# Patient Record
Sex: Female | Born: 1970 | Race: White | Hispanic: No | Marital: Married | Smoking: Never smoker
Health system: Southern US, Community
[De-identification: ages and names within clinical notes are randomized; demographics above are authoritative.]

## PROBLEM LIST (undated history)

## (undated) DIAGNOSIS — G510 Bell's palsy: Secondary | ICD-10-CM

## (undated) HISTORY — PX: HAND SURGERY: SHX662

## (undated) HISTORY — PX: NASAL SEPTUM SURGERY: SHX37

## (undated) HISTORY — DX: Bell's palsy: G51.0

## (undated) HISTORY — PX: MOUTH SURGERY: SHX715

## (undated) HISTORY — PX: APPENDECTOMY: SHX54

---

## 2001-03-21 ENCOUNTER — Observation Stay (HOSPITAL_BASED_OUTPATIENT_CLINIC_OR_DEPARTMENT_OTHER)
Admission: RE | Admit: 2001-03-21 | Disposition: A | Discharge status: Home / Self Care | Payer: Self-pay | Source: Ambulatory Visit | Admitting: Obstetrics & Gynecology

## 2001-04-08 ENCOUNTER — Inpatient Hospital Stay (HOSPITAL_BASED_OUTPATIENT_CLINIC_OR_DEPARTMENT_OTHER)
Admission: RE | Admit: 2001-04-08 | Disposition: A | Discharge status: Home / Self Care | Payer: Self-pay | Source: Ambulatory Visit | Admitting: Obstetrics & Gynecology

## 2002-06-29 ENCOUNTER — Emergency Department
Admission: EM | Admit: 2002-06-29 | Disposition: A | Discharge status: Home / Self Care | Payer: Self-pay | Source: Emergency Department | Admitting: Internal Medicine

## 2004-01-12 ENCOUNTER — Emergency Department
Admission: EM | Admit: 2004-01-12 | Disposition: A | Discharge status: Home / Self Care | Payer: Self-pay | Source: Emergency Department

## 2006-02-11 ENCOUNTER — Ambulatory Visit
Admission: AD | Admit: 2006-02-11 | Disposition: A | Discharge status: Home / Self Care | Payer: Self-pay | Source: Ambulatory Visit | Admitting: Orthopaedic Surgery

## 2006-08-04 ENCOUNTER — Emergency Department
Admission: EM | Admit: 2006-08-04 | Disposition: A | Discharge status: Home / Self Care | Payer: Self-pay | Source: Emergency Department | Admitting: Emergency Medicine

## 2006-09-26 ENCOUNTER — Emergency Department
Admission: EM | Admit: 2006-09-26 | Disposition: A | Discharge status: Home / Self Care | Payer: Self-pay | Source: Emergency Department | Admitting: Emergency Medicine

## 2006-09-28 LAB — URINALYSIS
Bilirubin, UA: NEGATIVE
Blood, UA: NEGATIVE
Glucose, UA: NEGATIVE
Ketones UA: NEGATIVE
Leukocyte Esterase, UA: NEGATIVE
Nitrate: NEGATIVE
Protein, UR: NEGATIVE
Specific Gravity, UR: 1.02 (ref 1.000–1.035)
Urobilinogen, UA: NORMAL
pH, Urine: 7.5 (ref 5.0–8.0)

## 2006-09-28 LAB — COMPREHENSIVE METABOLIC PANEL
ALT: 20 U/L (ref 7–56)
AST (SGOT): 22 U/L (ref 5–40)
Albumin, Synovial: 3.9 g/dL (ref 3.9–5.0)
Alkaline Phosphatase: 40 U/L (ref 38–126)
BUN / Creatinine Ratio: 15 (ref 8–20)
BUN: 12 mg/dL (ref 6–20)
Bilirubin, Total: 0.4 mg/dL (ref 0.2–1.3)
CO2: 25 mmol/L (ref 21.0–31.0)
Calcium: 9.2 mg/dL (ref 8.4–10.2)
Chloride: 104 mmol/L (ref 101–111)
Creatinine: 0.79 mg/dL (ref 0.5–1.4)
EGFR: >60 mL/min/{1.73_m2}
EGFR: >60 mL/min/{1.73_m2}
Glucose: 77 mg/dL (ref 70–100)
Potassium: 4.1 mmol/L (ref 3.6–5.0)
Protein, Total: 6.5 g/dL (ref 6.3–8.2)
Sodium: 137 mmol/L (ref 135–145)

## 2006-09-28 LAB — ^CBC WITH DIFF MCKESSON
BASOPHILS %: 0.3 % (ref 0–2)
Baso(Absolute): 0
Eosinophils %: 4.2 % (ref 0–6)
Eosinophils Absolute: 0.3
Hematocrit: 39.3 % (ref 27.0–49.5)
Hemoglobin: 13 g/dL (ref 11.7–15.5)
Lymphocytes Absolute: 2.2
Lymphocytes Relative: 37.3 % (ref 25–55)
MCH: 28.9 pg (ref 27.0–34.0)
MCHC: 33.1 % (ref 32.0–36.0)
MCV: 87.2 fL (ref 80–100)
Monocytes Absolute: 0.4
Monocytes Relative %: 6.9 % (ref 1–8)
Neutrophils Absolute: 3.1
Neutrophils Relative %: 51.3 % (ref 49–69)
Platelets: 293 10*3/uL (ref 150–400)
RBC: 4.51 /mm3 (ref 3.80–5.40)
RDW: 12.1 % (ref 11.0–14.0)
WBC: 6 10*3/uL (ref 4.8–10.8)

## 2006-09-28 LAB — HCG, SERUM, QUALITATIVE: Hcg Qualitative: NEGATIVE

## 2006-09-28 LAB — CHLAMYDIA TRACH/GC AMPLIFIED DETECTION
C. Trachomatis Amplified: NEGATIVE
N. Gonorrhoeae Amplified: NEGATIVE

## 2006-10-03 ENCOUNTER — Emergency Department
Admission: EM | Admit: 2006-10-03 | Disposition: A | Discharge status: Home / Self Care | Payer: Self-pay | Source: Emergency Department | Admitting: Emergency Medicine

## 2013-02-18 ENCOUNTER — Ambulatory Visit (HOSPITAL_BASED_OUTPATIENT_CLINIC_OR_DEPARTMENT_OTHER): Payer: BC Managed Care – PPO | Admitting: Anesthesiology

## 2013-02-18 ENCOUNTER — Ambulatory Visit (HOSPITAL_BASED_OUTPATIENT_CLINIC_OR_DEPARTMENT_OTHER): Payer: BC Managed Care – PPO | Admitting: Obstetrics & Gynecology

## 2013-02-18 ENCOUNTER — Ambulatory Visit
Admission: RE | Admit: 2013-02-18 | Discharge: 2013-02-18 | Disposition: A | Discharge status: Home / Self Care | Payer: BC Managed Care – PPO | Source: Ambulatory Visit | Attending: Obstetrics & Gynecology | Admitting: Obstetrics & Gynecology

## 2013-02-18 ENCOUNTER — Encounter (HOSPITAL_BASED_OUTPATIENT_CLINIC_OR_DEPARTMENT_OTHER): Payer: Self-pay

## 2013-02-18 ENCOUNTER — Ambulatory Visit: Payer: Self-pay

## 2013-02-18 ENCOUNTER — Encounter (HOSPITAL_BASED_OUTPATIENT_CLINIC_OR_DEPARTMENT_OTHER): Payer: Self-pay | Admitting: Anesthesiology

## 2013-02-18 ENCOUNTER — Encounter (HOSPITAL_BASED_OUTPATIENT_CLINIC_OR_DEPARTMENT_OTHER)
Admission: RE | Disposition: A | Discharge status: Home / Self Care | Payer: Self-pay | Source: Ambulatory Visit | Attending: Obstetrics & Gynecology

## 2013-02-18 DIAGNOSIS — O034 Incomplete spontaneous abortion without complication: Secondary | ICD-10-CM | POA: Insufficient documentation

## 2013-02-18 DIAGNOSIS — O021 Missed abortion: Secondary | ICD-10-CM | POA: Diagnosis present

## 2013-02-18 HISTORY — PX: D & C, SUCTION: SHX3666

## 2013-02-18 SURGERY — DILATION AND CURETTAGE (D&C), SUCTION
Anesthesia: Anesthesia General | Site: Uterus | Wound class: Clean Contaminated

## 2013-02-18 MED ORDER — LIDOCAINE HCL 2 % IJ SOLN
INTRAMUSCULAR | Status: DC | PRN
Start: 2013-02-18 — End: 2013-02-18
  Administered 2013-02-18: 50 mg

## 2013-02-18 MED ORDER — EPHEDRINE SULFATE 50 MG/ML IJ SOLN
INTRAMUSCULAR | Status: AC
Start: 2013-02-18 — End: ?
  Filled 2013-02-18: qty 1

## 2013-02-18 MED ORDER — SODIUM CHLORIDE 0.9 % IR SOLN
Status: DC | PRN
Start: 2013-02-18 — End: 2013-02-18
  Administered 2013-02-18: 1000 mL

## 2013-02-18 MED ORDER — FENTANYL CITRATE 0.05 MG/ML IJ SOLN
INTRAMUSCULAR | Status: AC
Start: 2013-02-18 — End: ?
  Filled 2013-02-18: qty 2

## 2013-02-18 MED ORDER — PROPOFOL INFUSION 10 MG/ML
INTRAVENOUS | Status: DC | PRN
Start: 2013-02-18 — End: 2013-02-18
  Administered 2013-02-18: 300 ug/kg/min via INTRAVENOUS

## 2013-02-18 MED ORDER — ONDANSETRON HCL 4 MG/2ML IJ SOLN
4.0000 mg | Freq: Three times a day (TID) | INTRAMUSCULAR | Status: DC | PRN
Start: 2013-02-18 — End: 2013-02-18

## 2013-02-18 MED ORDER — FENTANYL CITRATE 0.05 MG/ML IJ SOLN
25.0000 ug | INTRAMUSCULAR | Status: DC | PRN
Start: 2013-02-18 — End: 2013-02-18
  Administered 2013-02-18: 25 ug via INTRAVENOUS

## 2013-02-18 MED ORDER — PROPOFOL 10 MG/ML IV EMUL
INTRAVENOUS | Status: AC
Start: 2013-02-18 — End: ?
  Filled 2013-02-18: qty 50

## 2013-02-18 MED ORDER — ONDANSETRON HCL 4 MG/2ML IJ SOLN
4.0000 mg | Freq: Once | INTRAMUSCULAR | Status: DC | PRN
Start: 2013-02-18 — End: 2013-02-18

## 2013-02-18 MED ORDER — HYDROMORPHONE HCL PF 1 MG/ML IJ SOLN
0.5000 mg | INTRAMUSCULAR | Status: DC | PRN
Start: 2013-02-18 — End: 2013-02-18

## 2013-02-18 MED ORDER — KETOROLAC TROMETHAMINE 30 MG/ML IJ SOLN
INTRAMUSCULAR | Status: DC | PRN
Start: 2013-02-18 — End: 2013-02-18
  Administered 2013-02-18: 30 mg via INTRAVENOUS

## 2013-02-18 MED ORDER — OXYCODONE-ACETAMINOPHEN 5-325 MG PO TABS
1.0000 | ORAL_TABLET | Freq: Once | ORAL | Status: DC | PRN
Start: 2013-02-18 — End: 2013-02-18

## 2013-02-18 MED ORDER — MIDAZOLAM HCL 2 MG/2ML IJ SOLN
INTRAMUSCULAR | Status: AC
Start: 2013-02-18 — End: ?
  Filled 2013-02-18: qty 2

## 2013-02-18 MED ORDER — FENTANYL CITRATE 0.05 MG/ML IJ SOLN
INTRAMUSCULAR | Status: DC | PRN
Start: 2013-02-18 — End: 2013-02-18
  Administered 2013-02-18 (×4): 25 ug via INTRAVENOUS

## 2013-02-18 MED ORDER — MIDAZOLAM HCL 2 MG/2ML IJ SOLN
INTRAMUSCULAR | Status: DC | PRN
Start: 2013-02-18 — End: 2013-02-18
  Administered 2013-02-18: 2 mg via INTRAVENOUS

## 2013-02-18 MED ORDER — LACTATED RINGERS IV SOLN
INTRAVENOUS | Status: DC | PRN
Start: 2013-02-18 — End: 2013-02-18

## 2013-02-18 MED ORDER — LACTATED RINGERS IV SOLN
INTRAVENOUS | Status: DC
Start: 2013-02-18 — End: 2013-02-18

## 2013-02-18 MED ORDER — ONDANSETRON HCL 4 MG/2ML IJ SOLN
INTRAMUSCULAR | Status: DC | PRN
Start: 2013-02-18 — End: 2013-02-18
  Administered 2013-02-18: 4 mg via INTRAVENOUS

## 2013-02-18 MED ORDER — MEPERIDINE HCL 25 MG/ML IJ SOLN
25.0000 mg | INTRAMUSCULAR | Status: DC | PRN
Start: 2013-02-18 — End: 2013-02-18

## 2013-02-18 MED ORDER — DIPHENHYDRAMINE HCL 50 MG/ML IJ SOLN
6.2500 mg | Freq: Four times a day (QID) | INTRAMUSCULAR | Status: DC | PRN
Start: 2013-02-18 — End: 2013-02-18

## 2013-02-18 SURGICAL SUPPLY — 16 items
BOTTLE COLECTION SAFETY W/CAPS (Procedure Accessories) ×2 IMPLANT
CANNULA VAC ASP CRV BRKLY VCRT 8MM LF (Suction) ×2
CANNULA VACUUM ASPIRATION OD8 MM CURVE (Suction) ×1 IMPLANT
CANNULA VACUUM ASPIRATION OD8 MM CURVE BERKELEY VACURETTE ROUND TIP (Suction) IMPLANT
CATHETER URETHRAL OD14 FR STAGGER DRAINAGE EYE ROUND CLOSED TIP (Catheter Urine) IMPLANT
CATHETER URTH PVC DOV RBNL 14FR 16IN LF (Catheter Urine) IMPLANT
CONNECTOR TUBING L18 IN BOTTLE TO BOTTLE (Tubing) ×1 IMPLANT
CONNECTOR TUBING L18 IN GYRUS ACMI BOTTLE TO BOTTLE HOSE CONNECT (Tubing) ×1 IMPLANT
GLOVE SURG BIOGEL INDIC SZ 6.5 (Glove) ×2 IMPLANT
GLOVE SURG BIOGEL PF LTX SZ6.0 (Glove) ×2 IMPLANT
HOSE SAFETOUCH CONNECT DISP (Tubing) ×2
PACK LITHOTOMY MINOR (Pack) ×2 IMPLANT
PAD SANITARY L12.25 IN X W4.25 IN HEAVY ABSORBENT MOISTURE BARRIER (Dressing) IMPLANT
PAD SNTR SLK FLF CRTY 12.25X4.25IN LF NS (Dressing) ×2 IMPLANT
TUBING SCT PLS 3/8IN 6FT LF STRL SWVL (Tubing) ×1 IMPLANT
TUBING W HDL 3/8X6 STRL (Tubing) ×1

## 2013-02-18 NOTE — Op Note (Signed)
BRIEF OP NOTE    Date Time: 02/18/2013 8:57 AM    Patient Name:   Tina Gibson    Date of Operation:   02/18/2013    Providers Performing:   Surgeon(s):  Toleen Lachapelle, Ervin Knack, MD    Assistant (s):   Circulator  Scrub Person    Operative Procedure:   Procedure(s):  D & C, SUCTION    Preoperative Diagnosis:   Pre-Op Diagnosis Codes:     * Incomplete spontaneous abortion without mention of complication [634.91]    Postoperative Diagnosis:   * No post-op diagnosis entered *    Anesthesia:   IV Sedation    Estimated Blood Loss:   minimal    Implants:   * No implants in log *    Drains:   Drains: no    Specimens:   Minimal POC    Findings:   9cm uterus, minimal tissue removed    Complications:   none      Signed by: Chrissie Noa, MD                                                                           [facility]

## 2013-02-18 NOTE — Anesthesia Preprocedure Evaluation (Signed)
Anesthesia Evaluation    AIRWAY    Mallampati: II    TM distance: >3 FB  Neck ROM: full  Mouth Opening:full   CARDIOVASCULAR    regular and normal       DENTAL    No notable dental hx     PULMONARY    clear to auscultation     OTHER FINDINGS                      Anesthesia Plan    ASA 1     general                                 informed consent obtained    Plan discussed with CRNA.

## 2013-02-18 NOTE — Anesthesia Postprocedure Evaluation (Signed)
The patient is awake or easily arousable.      The patients respirations, and cardiovascular status have been evaluated and deemed stable post op.     Post op nausea, vomiting and pain have been treated and controlled as effectively as possible without compromising the patients respiratory and cardiovascular status.    Please refer to Post Op PACU Documentation for confirmation of attainment of normothermia and adequate hydration status.    There were no obvious anesthetic related complications.    The patient has recovered adequately and stable for discharge from the PACU

## 2013-02-18 NOTE — Op Note (Signed)
Procedure Date: 02/18/2013     Patient Type: A     SURGEON: Chrissie Noa MD  ASSISTANT:       PREOPERATIVE DIAGNOSIS:  Incomplete abortion.     POSTOPERATIVE DIAGNOSIS:  Incomplete abortion.     TITLE OF PROCEDURE:  Suction D and C.        INDICATIONS:     Repeat stay of a 42 year old patient with a 6-week pregnancy by dates, a  small gestational sac noted in the uterus on ultrasound in the office and  beta hCGs inappropriately rising in the 280-range, who presents to the  hospital for the scheduled dilation and curettage for a likely incomplete  abortion.     ANESTHESIA:   IV sedation.       ESTIMATED BLOOD LOSS:    Minimal.     SPECIMENS:    Minimal products of conception.     FINDINGS:  A 9 cm uterus and minimal tissue.     COMPLICATIONS:  None.     CONDITION:  Patient stable to PACU.     DESCRIPTION OF PROCEDURE:  The patient was taken to the operating room, where she was prepped and  draped in a sterile fashion in dorsal lithotomy position.  Bimanual  examination was performed prior to procedure and was notable for a 9 cm  anteverted uterus.  A speculum was placed, and the anterior lip of the  cervix was grasped with a long Allis clamp.  The cervix was then serially  dilated and sounded to approximately 9 cm.  An 8 mm curved suction tip was  used to remove a minimal amount of products of conception.  This was sent  to pathology for examination.  A medium-sized curette was then used to  completely curettage the remainder of the cavity.  Bleeding was minimal  throughout the procedure.  All hardware was then removed from the patient's  vagina, and she was taken stable to PACU.  She will be followed in the  office with a repeat beta hCG level to make sure that it completely falls  to 0 and that an ectopic is completely excluded.  Sponge, laparotomy, and  needle counts were correct x2.           D:  02/18/2013 09:59 AM by Dr. Chrissie Noa, MD ([id number])  T:  02/18/2013 13:23 PM by [id number]      Everlean Cherry: [id number])  (Doc ID: [id number])

## 2013-02-18 NOTE — Discharge Instructions (Signed)
Discharge Instructions for Gynecologic Surgery  You had gynecologic surgery. This sheet contains information about what you can and can't do after your surgery. Remember, you need to take it easy.  Activity   Continue the coughing and deep breathing exercises that you learned in the hospital.   Limit your activity for 1 week.   Avoid strenuous activities, such as mowing the lawn, vacuuming, or playing sports.   Limit your activity to short, slow walks. Gradually increase your pace and distance as you feel able.   Listen to your body. If an activity causes pain, stop.   Rest when you are tired.   Don't have sexual intercourse or use tampons or douches until your doctor says it's safe to do so.  Home Care   Shower as needed.   Check your temperature every day for1week(s) after your surgery.   Take your medication exactly as instructed by your doctor.   Return to your diet as you feel able. Eat a healthy, well-balanced diet.   Avoid constipation.   Use laxatives, stool softeners, or enemas as directed by your doctor.   Eat more high-fiber foods.   Drink6-8 glasses of water every day, unless directed otherwise.  Follow-Up  Make a follow-up appointment as directed by our staff.    When to Call Your Doctor  Call your doctor right away if you have any of the following:   Fever above101.5F (38.6C) or chills   Bright red vaginal bleeding or afoul-smellingdischarge   Vaginal bleeding that soaks more than onesanitary pad per hour   Trouble urinating or burning sensationwhen you urinate   Severe abdominal pain or bloating   Redness, swelling, or drainage at your incision site   Shortness of breath    2000-2014 [employer], [address], [place] [address]. All rights reserved. This information is not intended as a substitute for professional medical care. Always follow your healthcare professional's instructions.

## 2013-02-18 NOTE — Transfer of Care (Signed)
Anesthesia Transfer of Care Note    Patient: Tina Gibson    Procedures performed: Procedure(s) with comments:  D & C, SUCTION    Anesthesia type: General TIVA    Patient location:Gyn PACU    Last vitals:   Filed Vitals:    02/18/13 1004   BP: 101/65   Pulse: 67   Temp: 98.3 F (36.8 C)   Resp: 18   SpO2: 99%       Post pain: Patient not complaining of pain, continue current therapy      Mental Status:awake and alert     Respiratory Function: tolerating face mask    Cardiovascular: stable    Nausea/Vomiting: patient not complaining of nausea or vomiting    Hydration Status: adequate    Post assessment: no apparent anesthetic complications and no reportable events

## 2013-02-19 ENCOUNTER — Encounter (HOSPITAL_BASED_OUTPATIENT_CLINIC_OR_DEPARTMENT_OTHER): Payer: Self-pay | Admitting: Obstetrics & Gynecology

## 2014-06-09 ENCOUNTER — Emergency Department (HOSPITAL_COMMUNITY)
Admission: EM | Admit: 2014-06-09 | Discharge: 2014-06-09 | Disposition: A | Discharge status: Home / Self Care | Payer: BLUE CROSS/BLUE SHIELD | Attending: Emergency Medicine | Admitting: Emergency Medicine

## 2014-06-09 ENCOUNTER — Encounter (HOSPITAL_COMMUNITY): Payer: Self-pay | Admitting: Emergency Medicine

## 2014-06-09 DIAGNOSIS — R079 Chest pain, unspecified: Secondary | ICD-10-CM | POA: Insufficient documentation

## 2014-06-09 DIAGNOSIS — M25512 Pain in left shoulder: Secondary | ICD-10-CM | POA: Diagnosis not present

## 2014-06-09 DIAGNOSIS — R0602 Shortness of breath: Secondary | ICD-10-CM | POA: Diagnosis present

## 2014-06-09 LAB — CBC
HEMATOCRIT: 44.1 % (ref 36.0–46.0)
Hemoglobin: 15.5 g/dL — ABNORMAL HIGH (ref 12.0–15.0)
MCH: 30.6 pg (ref 26.0–34.0)
MCHC: 35.1 g/dL (ref 30.0–36.0)
MCV: 87.2 fL (ref 78.0–100.0)
PLATELETS: 181 10*3/uL (ref 150–400)
RBC: 5.06 MIL/uL (ref 3.87–5.11)
RDW: 12.5 % (ref 11.5–15.5)
WBC: 7.1 10*3/uL (ref 4.0–10.5)

## 2014-06-09 LAB — BASIC METABOLIC PANEL
Anion gap: 8 (ref 5–15)
BUN: 8 mg/dL (ref 6–23)
CHLORIDE: 105 meq/L (ref 96–112)
CO2: 23 mmol/L (ref 19–32)
Calcium: 9.1 mg/dL (ref 8.4–10.5)
Creatinine, Ser: 0.6 mg/dL (ref 0.50–1.10)
GFR calc Af Amer: >90 mL/min (ref >90)
GFR calc non Af Amer: >90 mL/min (ref >90)
Glucose, Bld: 93 mg/dL (ref 70–99)
POTASSIUM: 3.9 mmol/L (ref 3.5–5.1)
Sodium: 136 mmol/L (ref 135–145)

## 2014-06-09 LAB — I-STAT TROPONIN, ED
TROPONIN I, POC: 0 ng/mL (ref 0.00–0.08)
Troponin i, poc: 0 ng/mL (ref 0.00–0.08)

## 2014-06-09 NOTE — ED Notes (Signed)
Pt c/o intermittent left shoulder pain onset yesterday. Pt reports feels like someone is squeezing her heart with shortness of breath onset today.

## 2014-06-09 NOTE — ED Notes (Addendum)
Pt c/o L arm pain and numbness starting 12/31; reports pain worsened this morning. Initial arm pain associated with numbness, pain radiating into L shoulder blade, with squeezing chest pain. Pt reports this morning chest pain felt "like someone had my heart in their hand." Increased shortness of breath noted with pain. Pt denies recent travel or cardiac hx

## 2014-06-09 NOTE — ED Provider Notes (Signed)
CSN: [id number]     Arrival date & time 06/09/14  1647 History   First MD Initiated Contact with Patient 06/09/14 1941     Chief Complaint  Patient presents with  . Shoulder Pain  . Shortness of Breath     (Consider location/radiation/quality/duration/timing/severity/associated sxs/prior Treatment) HPI Comments: 44 year old female with no significant echo history, no smoking, no cardiac history no significant family history of cardiac presents with squeezing chest pain radiating to left shoulder blade and numbness down left arm since 12/31. Initially improved and then worsened today. Mild shortness of breath. Patient denies blood clot history, active cancer, recent major trauma or surgery, unilateral leg swelling/ pain, recent long travel, hemoptysis or oral contraceptives. Patient currently feels much improved. No tearing sensation, no cardiac history.   Patient is a 44 y.o. female presenting with shoulder pain and shortness of breath. The history is provided by the patient.  Shoulder Pain Associated symptoms: no back pain, no fever and no neck pain   Shortness of Breath Associated symptoms: chest pain   Associated symptoms: no abdominal pain, no fever, no headaches, no neck pain, no rash and no vomiting     History reviewed. No pertinent past medical history. Past Surgical History  Procedure Laterality Date  . Appendectomy    . Mouth surgery     No family history on file. History  Substance Use Topics  . Smoking status: Never Smoker   . Smokeless tobacco: Not on file  . Alcohol Use: Yes   OB History    No data available     Review of Systems  Constitutional: Negative for fever and chills.  HENT: Negative for congestion.   Eyes: Negative for visual disturbance.  Respiratory: Positive for shortness of breath.   Cardiovascular: Positive for chest pain. Negative for leg swelling.  Gastrointestinal: Negative for vomiting and abdominal pain.  Genitourinary: Negative for  dysuria and flank pain.  Musculoskeletal: Negative for back pain, neck pain and neck stiffness.  Skin: Negative for rash.  Neurological: Negative for syncope, light-headedness and headaches.      Allergies  Review of patient's allergies indicates no known allergies.  Home Medications   Prior to Admission medications   Not on File   BP 108/61 mmHg  Pulse 69  Temp(Src) 98.8 F (37.1 C) (Oral)  Resp 17  Ht  (1.676 m)  Wt 148 lb (67.132 kg)  BMI 23.90 kg/m2  SpO2 99%  LMP 05/09/2014 (Approximate) Physical Exam  Constitutional: She is oriented to person, place, and time. She appears well-developed and well-nourished.  HENT:  Head: Normocephalic and atraumatic.  Eyes: Conjunctivae are normal. Right eye exhibits no discharge. Left eye exhibits no discharge.  Neck: Normal range of motion. Neck supple. No tracheal deviation present.  Cardiovascular: Normal rate, regular rhythm and intact distal pulses.   No murmur heard. Pulmonary/Chest: Effort normal and breath sounds normal.  Abdominal: Soft. She exhibits no distension. There is no tenderness. There is no guarding.  Musculoskeletal: She exhibits no edema or tenderness.  Neurological: She is alert and oriented to person, place, and time.  Skin: Skin is warm. No rash noted.  Psychiatric: She has a normal mood and affect.  Nursing note and vitals reviewed.   ED Course  Procedures (including critical care time) Labs Review Labs Reviewed  CBC - Abnormal; Notable for the following:    Hemoglobin 15.5 (*)    All other components within normal limits  BASIC METABOLIC PANEL  I-STAT TROPOININ, ED  I-STAT  TROPOININ, ED    Imaging Review No results found.   EKG Interpretation   Date/Time:  Saturday June 09 2014 16:53:01 EST Ventricular Rate:  70 PR Interval:  148 QRS Duration: 72 QT Interval:  390 QTC Calculation: 421 R Axis:   76 Text Interpretation:  Normal sinus rhythm Normal ECG Confirmed by Ishika Chesterfield   MD,  Aniyia Rane ([id number]) on 06/09/2014 7:54:48 PM      MDM   Final diagnoses:  Chest pain, unspecified chest pain type   Well-appearing patient, no significant chest pain currently, patient very low risk cardiac, very low risk pulmonary embolism. Patient PE RC negative, no indication for d-dimer. Blood work and troponin negative. Normal blood pressure equal pulses, no tearing sensation, very low suspicion for dissection. Discussed benefits and risks of chest x-ray, patient wishes to hold at this time and will follow-up outpatient discussed stress test and further workup if chest pain worsens. Very low heart score.  Results and differential diagnosis were discussed with the patient/parent/guardian. Close follow up outpatient was discussed, comfortable with the plan.   Medications - No data to display  Filed Vitals:   06/09/14 1943 06/09/14 1944 06/09/14 1950 06/09/14 2000  BP: 108/81  108/81 108/61  Pulse:  65 68 69  Temp:      TempSrc:      Resp:  Height:      Weight:      SpO2:  99% 99% 99%    Final diagnoses:  Chest pain, unspecified chest pain type     Results and differential diagnosis were discussed with the patient/parent/guardian. Close follow up outpatient was discussed, comfortable with the plan.   Medications - No data to display  Filed Vitals:   06/09/14 1943 06/09/14 1944 06/09/14 1950 06/09/14 2000  BP: 108/81  108/81 108/61  Pulse:  65 68 69  Temp:      TempSrc:      Resp:  Height:      Weight:      SpO2:  99% 99% 99%    Final diagnoses:  Chest pain, unspecified chest pain type        Enid Skeens, MD 06/09/14 2057

## 2014-06-09 NOTE — Discharge Instructions (Signed)
If you have worsening symptoms take an aspirin and return to the ER, if you continue to feel improved follow-up locally with a local doctor in the local cardiologist to discuss stress test.  If you were given medicines take as directed.  If you are on coumadin or contraceptives realize their levels and effectiveness is altered by many different medicines.  If you have any reaction (rash, tongues swelling, other) to the medicines stop taking and see a physician.   Please follow up as directed and return to the ER or see a physician for new or worsening symptoms.  Thank you. Filed Vitals:   06/09/14 1943 06/09/14 1944 06/09/14 1950 06/09/14 2000  BP: 108/81  108/81 108/61  Pulse:  65 68 69  Temp:      TempSrc:      Resp:  Height:      Weight:      SpO2:  99% 99% 99%

## 2014-06-15 ENCOUNTER — Ambulatory Visit (INDEPENDENT_AMBULATORY_CARE_PROVIDER_SITE_OTHER): Payer: BLUE CROSS/BLUE SHIELD | Admitting: Cardiovascular Disease

## 2014-06-15 ENCOUNTER — Encounter: Payer: Self-pay | Admitting: Cardiovascular Disease

## 2014-06-15 VITALS — BP 100/58 | HR 71 | Ht 66.0 in | Wt 153.8 lb

## 2014-06-15 DIAGNOSIS — D582 Other hemoglobinopathies: Secondary | ICD-10-CM

## 2014-06-15 DIAGNOSIS — R079 Chest pain, unspecified: Secondary | ICD-10-CM | POA: Insufficient documentation

## 2014-06-15 DIAGNOSIS — R0789 Other chest pain: Secondary | ICD-10-CM

## 2014-06-15 LAB — CBC WITH DIFFERENTIAL/PLATELET
BASOS ABS: 0 10*3/uL (ref 0.0–0.1)
Basophils Relative: 0.4 % (ref 0.0–3.0)
EOS PCT: 0.9 % (ref 0.0–5.0)
Eosinophils Absolute: 0 10*3/uL (ref 0.0–0.7)
HCT: 42.8 % (ref 36.0–46.0)
Hemoglobin: 14.4 g/dL (ref 12.0–15.0)
Lymphocytes Relative: 30.5 % (ref 12.0–46.0)
Lymphs Abs: 1.7 10*3/uL (ref 0.7–4.0)
MCHC: 33.7 g/dL (ref 30.0–36.0)
MCV: 91.4 fl (ref 78.0–100.0)
MONO ABS: 0.3 10*3/uL (ref 0.1–1.0)
MONOS PCT: 5.8 % (ref 3.0–12.0)
Neutro Abs: 3.4 10*3/uL (ref 1.4–7.7)
Neutrophils Relative %: 62.4 % (ref 43.0–77.0)
PLATELETS: 173 10*3/uL (ref 150.0–400.0)
RBC: 4.68 Mil/uL (ref 3.87–5.11)
RDW: 13 % (ref 11.5–15.5)
WBC: 5.5 10*3/uL (ref 4.0–10.5)

## 2014-06-15 NOTE — Assessment & Plan Note (Signed)
Elizabeth Savage presents with Chest pain .  Her pains are very atypical.  Do not sound like a cardiac issue at all No pleuretic. Doubt PE ( + her O2 sats are normal) Pains have gradually resolved.  At this point, I think we can observe her  and not do any other testing. I will see her in 1 month.  She will call for other problems.

## 2014-06-15 NOTE — Patient Instructions (Signed)
Your physician recommends that you have lab work:  TODAY - CBC  Your physician recommends that you schedule a follow-up appointment in: 4 weeks with Dr. Elease HashimotoNahser

## 2014-06-15 NOTE — Progress Notes (Signed)
     Colbert Ewingracy Borland Date of Birth  1971-05-29       [facility]    [place] [facility] [address], [address]  [address], [address] [place], [place]  [address]   [place], [place]  [id number] [phone]     [phone]   Fax  [phone]     Fax [phone]  Problem List: 1. Chest pain  History of Present Illness:   Elizabeth Savage is a 44 yo who is referred from the ER with episodes of chest pain. Elizabeth Savage is a healthy female.  She started having CP New years Eve.  The pain started in left upper chest,  Stabbing in quality.  Lasted only a very brief time.  Also had some back pain.  Occurred off and on for 2 days. Also had some pleuretic CP,  Difficulty in taking a deep breath.  Felt like someone "had their hand on her heart" Has gradually improved.  No severe pains for the past 6 days.  Has been able to do all of her usual activities without difficulities. Still says she is NOT at 100% yet.    Has not been eating as well since the move.  Getting back into exercising.  Used to be very healthy. Non smoker, ETOH.  Has 2 boys, 12 and 10.   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  Husband teaches at [employer] ( [employer])     No current outpatient prescriptions on file prior to visit.   No current facility-administered medications on file prior to visit.    No Known Allergies  No past medical history on file.  Past Surgical History  Procedure Laterality Date  . Appendectomy    . Mouth surgery      History  Smoking status  . Never Smoker   Smokeless tobacco  . Not on file    History  Alcohol Use  . Yes    No family history on file.  Reviw of Systems:  Reviewed in the HPI.  All other systems are negative.  Physical Exam: Blood pressure 100/58, pulse 71, height 5\' 6"  (1.676 m), weight 153 lb 12.8 oz (69.763 kg), last menstrual period 05/09/2014, SpO2 99 %. Wt Readings from Last 3  Encounters:  06/15/14 153 lb 12.8 oz (69.763 kg)  06/09/14 148 lb (67.132 kg)     General: Well developed, well nourished, in no acute distress.  Head: Normocephalic, atraumatic, sclera non-icteric, mucus membranes are moist,   Neck: Supple. Carotids are 2 + without bruits. No JVD   Lungs: Clear   Heart: RR, normal S1S2, no murmurs  Abdomen: Soft, non-tender, non-distended with normal bowel sounds.  Msk:  Strength and tone are normal   Extremities: No clubbing or cyanosis. No edema.  Distal pedal pulses are 2+ and equal    Neuro: CN II - XII intact.  Alert and oriented X 3.   Psych:  Normal   ECG: Jan. 2, 2016:  NSR , no ST abn.  Normal ECG.   Assessment / Plan:

## 2014-07-13 ENCOUNTER — Ambulatory Visit: Payer: BLUE CROSS/BLUE SHIELD | Admitting: Cardiovascular Disease

## 2015-08-02 ENCOUNTER — Observation Stay: Payer: No Typology Code available for payment source

## 2015-08-02 ENCOUNTER — Emergency Department
Admission: EM | Admit: 2015-08-02 | Discharge: 2015-08-02 | Disposition: A | Discharge status: Other Acute Inpatient Hospital | Payer: No Typology Code available for payment source | Attending: Emergency Medicine | Admitting: Emergency Medicine

## 2015-08-02 ENCOUNTER — Observation Stay: Payer: No Typology Code available for payment source | Admitting: Surgery

## 2015-08-02 ENCOUNTER — Emergency Department: Payer: No Typology Code available for payment source

## 2015-08-02 ENCOUNTER — Observation Stay
Admission: EM | Admit: 2015-08-02 | Discharge: 2015-08-03 | Disposition: A | Discharge status: Home / Self Care | Payer: No Typology Code available for payment source | Attending: Surgery | Admitting: Surgery

## 2015-08-02 DIAGNOSIS — I609 Nontraumatic subarachnoid hemorrhage, unspecified: Secondary | ICD-10-CM | POA: Insufficient documentation

## 2015-08-02 DIAGNOSIS — W1830XA Fall on same level, unspecified, initial encounter: Secondary | ICD-10-CM | POA: Insufficient documentation

## 2015-08-02 DIAGNOSIS — R402361 Coma scale, best motor response, obeys commands, in the field [EMT or ambulance]: Secondary | ICD-10-CM | POA: Insufficient documentation

## 2015-08-02 DIAGNOSIS — G8911 Acute pain due to trauma: Secondary | ICD-10-CM

## 2015-08-02 DIAGNOSIS — Z789 Other specified health status: Secondary | ICD-10-CM

## 2015-08-02 DIAGNOSIS — R112 Nausea with vomiting, unspecified: Secondary | ICD-10-CM | POA: Insufficient documentation

## 2015-08-02 DIAGNOSIS — R001 Bradycardia, unspecified: Secondary | ICD-10-CM | POA: Insufficient documentation

## 2015-08-02 DIAGNOSIS — R55 Syncope and collapse: Secondary | ICD-10-CM | POA: Insufficient documentation

## 2015-08-02 DIAGNOSIS — S066X1A Traumatic subarachnoid hemorrhage with loss of consciousness of 30 minutes or less, initial encounter: Principal | ICD-10-CM | POA: Insufficient documentation

## 2015-08-02 DIAGNOSIS — R402251 Coma scale, best verbal response, oriented, in the field [EMT or ambulance]: Secondary | ICD-10-CM | POA: Insufficient documentation

## 2015-08-02 DIAGNOSIS — R402141 Coma scale, eyes open, spontaneous, in the field [EMT or ambulance]: Secondary | ICD-10-CM | POA: Insufficient documentation

## 2015-08-02 DIAGNOSIS — W19XXXA Unspecified fall, initial encounter: Secondary | ICD-10-CM | POA: Insufficient documentation

## 2015-08-02 LAB — COMPREHENSIVE METABOLIC PANEL
ALT: 18 U/L (ref 0–55)
AST (SGOT): 19 U/L (ref 5–34)
Albumin/Globulin Ratio: 1.4 (ref 0.9–2.2)
Albumin: 3.8 g/dL (ref 3.5–5.0)
Alkaline Phosphatase: 45 U/L (ref 37–106)
Anion Gap: 11 (ref 5.0–15.0)
BUN: 11.1 mg/dL (ref 7.0–19.0)
Bilirubin, Total: 0.3 mg/dL (ref 0.2–1.2)
CO2: 22 mEq/L (ref 22–29)
Calcium: 9.2 mg/dL (ref 8.5–10.5)
Chloride: 110 mEq/L (ref 100–111)
Creatinine: 0.8 mg/dL (ref 0.6–1.0)
Globulin: 2.7 g/dL (ref 2.0–3.6)
Glucose: 98 mg/dL (ref 70–100)
Potassium: 4.2 mEq/L (ref 3.5–5.1)
Protein, Total: 6.5 g/dL (ref 6.0–8.3)
Sodium: 143 mEq/L (ref 136–145)

## 2015-08-02 LAB — ECG 12-LEAD
Atrial Rate: 59 {beats}/min
P Axis: 58 degrees
P-R Interval: 150 ms
Q-T Interval: 426 ms
QRS Duration: 82 ms
QTC Calculation (Bezet): 421 ms
R Axis: -27 degrees
T Axis: 16 degrees
Ventricular Rate: 59 {beats}/min

## 2015-08-02 LAB — GFR: EGFR: >60

## 2015-08-02 LAB — URINALYSIS, REFLEX TO MICROSCOPIC EXAM IF INDICATED
Bilirubin, UA: NEGATIVE
Blood, UA: NEGATIVE
Glucose, UA: NEGATIVE
Ketones UA: NEGATIVE
Leukocyte Esterase, UA: NEGATIVE
Nitrite, UA: NEGATIVE
Protein, UR: NEGATIVE
Specific Gravity UA: 1.012 (ref 1.001–1.035)
Urine pH: 6 (ref 5.0–8.0)
Urobilinogen, UA: NORMAL mg/dL

## 2015-08-02 LAB — CBC AND DIFFERENTIAL
Basophils Absolute Automated: 0.02 10*3/uL (ref 0.00–0.20)
Basophils Automated: 0 %
Eosinophils Absolute Automated: 0.06 10*3/uL (ref 0.00–0.70)
Eosinophils Automated: 1 %
Hematocrit: 39 % (ref 37.0–47.0)
Hgb: 12.9 g/dL (ref 12.0–16.0)
Immature Granulocytes Absolute: 0.01 10*3/uL
Immature Granulocytes: 0 %
Lymphocytes Absolute Automated: 1.28 10*3/uL (ref 0.50–4.40)
Lymphocytes Automated: 20 %
MCH: 29.6 pg (ref 28.0–32.0)
MCHC: 33.1 g/dL (ref 32.0–36.0)
MCV: 89.4 fL (ref 80.0–100.0)
MPV: 10 fL (ref 9.4–12.3)
Monocytes Absolute Automated: 0.47 10*3/uL (ref 0.00–1.20)
Monocytes: 7 %
Neutrophils Absolute: 4.65 10*3/uL (ref 1.80–8.10)
Neutrophils: 72 %
Platelets: 274 10*3/uL (ref 140–400)
RBC: 4.36 10*6/uL (ref 4.20–5.40)
RDW: 12 % (ref 12–15)
WBC: 6.48 10*3/uL (ref 3.50–10.80)

## 2015-08-02 LAB — URINE HCG QUALITATIVE: Urine HCG Qualitative: NEGATIVE

## 2015-08-02 MED ORDER — ONDANSETRON HCL 4 MG/2ML IJ SOLN
4.0000 mg | Freq: Once | INTRAMUSCULAR | Status: AC
Start: 2015-08-02 — End: 2015-08-02
  Administered 2015-08-02: 4 mg via INTRAVENOUS
  Filled 2015-08-02: qty 2

## 2015-08-02 MED ORDER — SODIUM CHLORIDE 0.9 % IV BOLUS
1000.0000 mL | Freq: Once | INTRAVENOUS | Status: AC
Start: 2015-08-02 — End: 2015-08-02
  Administered 2015-08-02: 1000 mL via INTRAVENOUS

## 2015-08-02 MED ORDER — ACETAMINOPHEN 500 MG PO TABS
1000.0000 mg | ORAL_TABLET | Freq: Four times a day (QID) | ORAL | Status: AC
Start: 2015-08-02 — End: 2015-08-03
  Administered 2015-08-02 (×2): 1000 mg via ORAL
  Filled 2015-08-02 (×2): qty 2

## 2015-08-02 MED ORDER — MORPHINE SULFATE 4 MG/ML IJ/IV SOLN (WRAP)
4.0000 mg | Freq: Once | Status: AC
Start: 2015-08-02 — End: 2015-08-02
  Administered 2015-08-02: 4 mg via INTRAVENOUS
  Filled 2015-08-02: qty 1

## 2015-08-02 MED ORDER — ONDANSETRON HCL 4 MG/2ML IJ SOLN
4.0000 mg | Freq: Three times a day (TID) | INTRAMUSCULAR | Status: DC | PRN
Start: 2015-08-02 — End: 2015-08-03
  Administered 2015-08-03 (×2): 4 mg via INTRAVENOUS
  Filled 2015-08-02 (×2): qty 2

## 2015-08-02 MED ORDER — ONDANSETRON HCL 4 MG PO TABS
4.0000 mg | ORAL_TABLET | Freq: Three times a day (TID) | ORAL | Status: DC | PRN
Start: 2015-08-02 — End: 2015-08-03
  Administered 2015-08-02: 4 mg via ORAL
  Filled 2015-08-02: qty 1

## 2015-08-02 MED ORDER — OXYCODONE HCL 5 MG PO TABS
5.0000 mg | ORAL_TABLET | ORAL | Status: DC | PRN
Start: 2015-08-02 — End: 2015-08-03
  Administered 2015-08-02 – 2015-08-03 (×3): 5 mg via ORAL
  Filled 2015-08-02 (×3): qty 1

## 2015-08-02 MED ORDER — SODIUM CHLORIDE 0.9 % IV SOLN
INTRAVENOUS | Status: DC
Start: 2015-08-02 — End: 2015-08-02

## 2015-08-02 MED ORDER — ONDANSETRON HCL 4 MG/2ML IJ SOLN
4.0000 mg | Freq: Once | INTRAMUSCULAR | Status: DC
Start: 2015-08-02 — End: 2015-08-02
  Filled 2015-08-02: qty 2

## 2015-08-02 MED ORDER — IBUPROFEN 400 MG PO TABS
800.0000 mg | ORAL_TABLET | Freq: Once | ORAL | Status: AC
Start: 2015-08-02 — End: 2015-08-02
  Administered 2015-08-02: 800 mg via ORAL
  Filled 2015-08-02: qty 2

## 2015-08-02 MED ORDER — OXYCODONE HCL 5 MG PO TABS
10.0000 mg | ORAL_TABLET | ORAL | Status: DC | PRN
Start: 2015-08-02 — End: 2015-08-03

## 2015-08-02 MED ORDER — NALOXONE HCL 0.4 MG/ML IJ SOLN (WRAP)
0.2000 mg | INTRAMUSCULAR | Status: DC | PRN
Start: 2015-08-02 — End: 2015-08-03

## 2015-08-02 NOTE — Consults (Signed)
NEUROSURGERY CONSULTATION    Date Time: 08/02/2015 5:32 AM  Patient Name: Tina Gibson  Requesting Physician: Gracelyn Nurse, MD  Consulting Physician: Dr. Rosemarie Beath  Covered By: Neurosurgery PA/pager [phone]    Time of Exam: 0530     Reason for Consultation:   Small traumatic subarachnoid hemorrhage, s/p syncopal event    History:   Tina Gibson is a 45 y.o. right handed female who presents to the hospital on 08/02/2015 with small left frontal traumatic subarachnoid hemorrhage, sustained when she had a syncopal episode around midnight tonight. Patient reports she is amnestic to event and denies any feeling of illness prior to event. Patient and spouse report that earlier in the evening she had been confused about some simple things that should have been no problem at all, and she was very confused when she woke from her fainting spell. There was no incontinence or tongue biting during the event. Patient was initially evaluated at [facility], where CT scan was obtained and showed the small hemorrhage as noted, prompting transfer to [facility] and Neurosurgical consult.  Patient denies history of seizure or migraine. Patient does admit to 3 small glasses of wine about 3-4 hours prior to event. Patient reports headache, photophobia; denies nausea/vomiting, dizziness, weakness, numbness/tingling, hearing changes, neck/back pain, chest pain, abdominal pain, shortness of breath.    Past Medical History:   History reviewed. No pertinent past medical history.    Past Surgical History:     Past Surgical History   Procedure Laterality Date   . D & c, suction  02/18/2013     Procedure: D & C, SUCTION;  Surgeon: Chrissie Noa, MD;  Location: [facility] OR;  Service: Gynecology;  Laterality: N/A;   . Hand surgery     . Nasal septum surgery         Family History:   No family history on file.    Social History:     Social History     Social History   . Marital Status: Married     Spouse Name: N/A   . Number of Children: N/A   . Years of  Education: N/A     Social History Main Topics   . Smoking status: Never Smoker    . Smokeless tobacco: Not on file   . Alcohol Use: Yes   . Drug Use: Not on file   . Sexual Activity: Not on file     Other Topics Concern   . Not on file     Social History Narrative       Allergies:   No Known Allergies    Medications:       Review of Systems:   A comprehensive review of systems was: Negative except as noted in HPI. All other systems were reviewed and are negative    Physical Exam:     Filed Vitals:    08/02/15 0428   BP: 118/78   Pulse: 71   Temp: 98.5 F (36.9 C)   Resp: 17   SpO2: 97%       Intake and Output Summary (Last 24 hours) at Date Time  No intake or output data in the 24 hours ending 08/02/15 0532    General Appearance: well appearing adult female, no obvious acute distress    Physical Exam:  Heart: RRR  Lungs: CTA  Abdomen: SNT    Awake alert oriented x 3   GCS: 15 E: 4 V: 5 M: 6  Speech is clear and fluent  PERRL 2 mm EOM: intact, face: symmetrical, Tongue: midline   CN II-XII: grossly intact  Strength/Motor function:   LUE  Delt: 5/5 Bi: 5/5 Tri: 5/5 Wfl: 5/5 Wex: 5/5 Grip: 5/5  RUE  Delt: 5/5 Bi: 5/5 Tri: 5/5 Wfl: 5/5 Wex: 5/5 Grip: 5/5  LLE  Psoas: 5/5 Quad: 5/5 Lfl: 5/5 Lex: 5/5 ADF: 5/5 APF: 5/5 EHL: 5/5  RLE  Psoas: 5/5 Quad: 5/5 Lfl: 5/5 Lex: 5/5 ADF: 5/5 APF: 5/5 EHL: 5/5  Sensation to light touch: intact  Pronator drift: none  DTRs:   Left: Bic: 2+ Pat: 2+ Ankle: 2+  Right: Bic: 2+ Pat: 2+ Ankle: 2+  Hoffmann's: negative  Clonus: negative  Babinski: downward bilaterally     Labs Reviewed:     Lab Results   Component Value Date    WBC 6.48 08/02/2015    HGB 12.9 08/02/2015    HCT 39.0 08/02/2015    MCV 89.4 08/02/2015    PLT 274 08/02/2015     Lab Results   Component Value Date    NA 143 08/02/2015    K 4.2 08/02/2015    CL 110 08/02/2015    CO2 22 08/02/2015     No results found for: INR, PT    Rads:     CT Head WO Contrast [[id number]] (Order [id number])    Status: Final result         Study  Result     TECHNIQUE: CT of the head without intravenous contrast.     The following dose reduction techniques were utilized: Automated  exposure control and/or adjustment of the mA and/or kV according to  patient size, and the use of iterative reconstruction technique.    INDICATION: Trauma. Headache.    COMPARISON: No relevant prior examination available for comparison.    FINDINGS:     Possible areas of acute subarachnoid hemorrhage in the left frontal  lobe.    No acute territorial infarct, mass effect, midline shift, tonsillar  herniation or extra-axial fluid collections.     Ventricles and basilar cisterns are intact.    Visualized paranasal sinuses, mastoid air cells, bones and soft tissues  are unremarkable.    IMPRESSION:       Possible areas of acute subarachnoid hemorrhage in the left frontal  lobe.    These findings were discussed with Dr. Oliver Barre at 2:22  AM.    [name], MD   08/02/2015 2:22 AM       Assessment:   45 year old female, s/p syncopal event; with small area of traumatic subarachnoid hemorrhage in left frontal lobe    Plan:   Admit to Trauma Service  Medical management per Trauma Team  q 4 hour neuro checks  INR goal <1.3  PFA, transfuse platelets if elevated  No anticoagulation/ASA/NSAIDs  No AED necessary unless patient has a seizure, due to small size of hemorrhage  Repeat CT for neuro decline  HOB> 30 degrees  Consider syncope work up  Consider Neurology consult for possible seizure or TIA  No acute neurosurgical intervention at this time      Signed by: Cecilio Asper PA-C  Date/Time: 08/02/2015 5:32 AM    Addendum:  I spoke with Dr. Deloria Lair. No NS intervention indicated. Repeat HCT for neuro change. No lovenox per protocol.  F/u with Dr. Deloria Lair PRN. Please call with any questions.    Maryan Char, PA-C          I have reviewed the  notes and examined the patient with and performed by PA Perry Mount , I concur with her/his documentation of Tina Gibson.    Dr. Marlaine Hind      Awake alert oriented x 3   GCS: 15 E: 4 V: 5 M: 6  Speech is clear and fluent   PERRL 2 mm EOM: intact, face: symmetrical, Tongue: midline   CN II-XII: grossly intact  Strength/Motor function:   LUE  Delt: 5/5 Bi: 5/5 Tri: 5/5 Wfl: 5/5 Wex: 5/5 Grip: 5/5  RUE  Delt: 5/5 Bi: 5/5 Tri: 5/5 Wfl: 5/5 Wex: 5/5 Grip: 5/5  LLE  Psoas: 5/5 Quad: 5/5 Lfl: 5/5 Lex: 5/5 ADF: 5/5 APF: 5/5 EHL: 5/5  RLE  Psoas: 5/5 Quad: 5/5 Lfl: 5/5 Lex: 5/5 ADF: 5/5 APF: 5/5 EHL: 5/5  Sensation to light touch: intact  Pronator drift: none  DTRs:   Left: Bic: 2+ Pat: 2+ Ankle: 2+  Right: Bic: 2+ Pat: 2+ Ankle: 2+  Hoffmann's: negative  Clonus: negative  Babinski: downward bilaterally         A. TSAH with SDH  P. No AEDS ; Rpt CTH for exam decline ; Neurochecks q 2 hr    JFH

## 2015-08-02 NOTE — ED Notes (Signed)
Per husband, pt went to [facility] party and had approx 3 glasses of wine.  Pt has been "incoherent" since coming home.  Per husband, pt fell backwards from standing position hitting head.  Very brief LOC per husband, didn't remember incident.  Pt presents alert, conversational.  C/o headache.

## 2015-08-02 NOTE — ED Notes (Signed)
Bed: S 8  Expected date:   Expected time:   Means of arrival:   Comments:  Tina Gibson

## 2015-08-02 NOTE — UM Notes (Signed)
Presented to ED 08/02/15 from [facility] for [facility] s/p fall vs syncope. + etoh. Amnesic to event. + nausea. zofran IV x 2 enroute. + photophobia. Transferred for neuro consult.    PMH: none reported    98.5  71  17  118/78  97%  Pain 1/10    Meds in ED: none    CT head at [facility]: Possible areas of acute subarachnoid hemorrhage in the left frontal  lobe.    Care Date #1  08/02/15  Admit obs status  Date and time of obs order: 08/02/15 0559    Clear diet, tylenol, IVF, oxycodone, tele, zofran po/iv, scd    Pain 0/10    NSGY note: Small traumatic subarachnoid hemorrhage. q 4 hour neuro checks  INR goal <1.3  PFA, transfuse platelets if elevated  No anticoagulation/ASA/NSAIDs  No AED necessary unless patient has a seizure, due to small size of hemorrhage  Repeat CT for neuro decline  HOB> 30 degrees  Consider syncope work up  Consider Neurology consult for possible seizure or TIA  No acute neurosurgical intervention at this time    Leafy Half RN BSN  Utilization Review Case Manager  [employer]  [phone]

## 2015-08-02 NOTE — ED Provider Notes (Signed)
Physician/Midlevel provider first contact with patient: 08/02/15 0201         EMERGENCY DEPARTMENT HISTORY AND PHYSICAL EXAM    Date Time: 08/02/2015 2:01 AM  Patient Name: Tina Gibson, Tina Gibson, 45 y.o., female  ED Provider: Louis Matte, M.D., FACEP    History of Presenting Illness:     Chief Complaint: Fall, headache  History obtained from: Patient.  Onset/Duration: PTA  Quality: Throbbing HA  Severity:7/10  Aggravating Factors: fall backward and wine earlier in the evening  Alleviating Factors:none  Associated Symptoms: N, confusion    Narrative/Additional Historical Findings:Tina Gibson is a 45 y.o. female  who had 3 small glasses of [place] wine earlier this evening and then fell asleep on the couch when she woke up later her husband felt she was a little incoherent and confused.  Pt.got up to go upstairs and fell backwards and hit the back of her head, LOC less than 30 seconds, did not remember the incident and after which she had nausea, confusion and a 7 out of 10 headache.  Denies neck pain, focal weakness, vision changes or speech abnormalities.  Denies any drug use.    Nursing notes from this date of service were reviewed.    Past Medical History:   History reviewed. No pertinent past medical history.    Past Surgical History:     Past Surgical History   Procedure Laterality Date   . D & c, suction  02/18/2013     Procedure: D & C, SUCTION;  Surgeon: Chrissie Noa, MD;  Location: [facility] OR;  Service: Gynecology;  Laterality: N/A;   . Hand surgery     . Nasal septum surgery         Family History:   No family history on file.    Social History:     Social History     Social History   . Marital Status: Married     Spouse Name: N/A   . Number of Children: N/A   . Years of Education: N/A     Social History Main Topics   . Smoking status: Never Smoker    . Smokeless tobacco: Not on file   . Alcohol Use: Yes   . Drug Use: Not on file   . Sexual Activity: Not on file     Other Topics Concern   . Not on  file     Social History Narrative       Allergies:   No Known Allergies    Medications:   No current facility-administered medications for this encounter.    Current outpatient prescriptions:   Marland Kitchen  Multiple Vitamin (MULTIVITAMIN) tablet, Take 1 tablet by mouth daily., Disp: , Rfl:     Review of Systems:     Review of Systems   Constitutional: Negative for fever and chills.   HENT: Negative for congestion and sore throat.    Eyes: Negative for pain and redness.   Respiratory: Negative for cough and shortness of breath.    Cardiovascular: Negative for chest pain and palpitations.   Gastrointestinal: Positive for nausea. Negative for vomiting and abdominal pain.   Genitourinary: Negative for dysuria and frequency.   Musculoskeletal: Negative for back pain.   Neurological: Positive for headaches. Negative for dizziness.   Psychiatric/Behavioral: Negative.      Physical Exam:   ED Triage Vitals   Enc Vitals Group      BP 08/02/15 0058 108/68 mmHg      Heart Rate 08/02/15 0058 72  Resp Rate 08/02/15 0058 14      Temp 08/02/15 0058 97.8 F (36.6 C)      Temp src --       SpO2 08/02/15 0058 97 %      Weight 08/02/15 0058 76.204 kg      Height 08/02/15 0058 1.651 m      Head Cir --       Peak Flow --       Pain Score 08/02/15 0058 8      Pain Loc --       Pain Edu? --       Excl. in GC? --      Physical Exam   Constitutional: She is oriented to person, place, and time. She appears well-developed and well-nourished.   HENT:   Head: Normocephalic and atraumatic.   Eyes: EOM are normal. Pupils are equal, round, and reactive to light.   Neck: Normal range of motion. Neck supple. No spinous process tenderness and no muscular tenderness present. No rigidity. Normal range of motion present. No Brudzinski's sign and no Kernig's sign noted.   Cardiovascular: Normal rate, regular rhythm and normal heart sounds.    Pulmonary/Chest: Effort normal and breath sounds normal.   Abdominal: Soft. Bowel sounds are normal.    Musculoskeletal: Normal range of motion.   Neurological: She is alert and oriented to person, place, and time.   Skin: Skin is warm and dry.   Psychiatric: She has a normal mood and affect.     Labs:     Labs Reviewed   URINE HCG QUALITATIVE   URINALYSIS, REFLEX TO MICROSCOPIC EXAM IF INDICATED   CBC AND DIFFERENTIAL   COMPREHENSIVE METABOLIC PANEL   GFR         Rads:     Radiology Results (24 Hour)     Procedure Component Value Units Date/Time    CT Head WO Contrast [[id number]] Collected:  08/02/15 0219    Order Status:  Completed Updated:  08/02/15 0226    Narrative:      TECHNIQUE: CT of the head without intravenous contrast.     The following dose reduction techniques were utilized: Automated  exposure control and/or adjustment of the mA and/or kV according to  patient size, and the use of iterative reconstruction technique.    INDICATION: Trauma. Headache.    COMPARISON: No relevant prior examination available for comparison.    FINDINGS:     Possible areas of acute subarachnoid hemorrhage in the left frontal  lobe.    No acute territorial infarct, mass effect, midline shift, tonsillar  herniation or extra-axial fluid collections.         Ventricles and basilar cisterns are intact.    Visualized paranasal sinuses, mastoid air cells, bones and soft tissues  are unremarkable.      Impression:          Possible areas of acute subarachnoid hemorrhage in the left frontal  lobe.    These findings were discussed with Dr. Oliver Barre at 2:22  AM.    [name], MD   08/02/2015 2:22 AM            MDM and ED Course   Louis Matte, M.D., FACEP is the primary attending for this patient and has obtained and performed the history, PE, and medical decision making for this patient.    MDM:    DDX to include but not limited to: ICB,SAH, epidural/subdural, skull FX, concussion.  Plan:  CT Head, pain meds, re-eval    Patient Vitals for the past 24 hrs:   BP Temp Pulse Resp SpO2 Height Weight   08/02/15 0253  124/68 mmHg - 74 15 97 % - -   08/02/15 0058 108/68 mmHg 97.8 F (36.6 C) 72 14 97 % 5\' 5"  (1.651 m) 76.204 kg       2:43 AM is discussed with Dr. Imogene Burn at [facility] ED.  Pt. with acute frontal subarachnoid hemorrhage.  Stable, mentating, GCS 15, will transfer by stat ground transport to [facility]    Critical Care  Performed by: Oliver Barre  Authorized by: Oliver Barre    Critical care provider statement:     Critical care time (minutes):  40    Critical care was necessary to treat or prevent imminent or life-threatening deterioration of the following conditions:  CNS failure or compromise, toxidrome and trauma    Critical care was time spent personally by me on the following activities:  Examination of patient, evaluation of patient's response to treatment, development of treatment plan with patient or surrogate, obtaining history from patient or surrogate, review of old charts, ordering and review of radiographic studies, ordering and review of laboratory studies and ordering and performing treatments and interventions      Nursing Notes: Reviewed and utilized available nursing notes.  Medical Records Reviewed: Reviewed available past medical records.  Counseling: The emergency provider has spoken with the patient and discussed today?s findings, in addition to providing specific details for the plan of care.  Questions are answered and there is agreement with the plan.          Assessment/Plan:   Results and instructions reviewed at the bedside with patient and family.    Clinical Impression  Final diagnoses:   SAH (subarachnoid hemorrhage)   Fall, initial encounter   Alcohol use       Disposition  ED Disposition     Transfer to [facility] ED           Prescriptions  New Prescriptions    No medications on file           Signed by: Oliver Barre, MD, FACEP  [facility]  [facility]    This note was generated by the Epic EMR/DRAGON speech recognition  dictation system and may contain errors not intended by the user.  Random grammatical errors, pronoun errors and omissions may be a consequences of this technology due to software limitations.  Not all errors are caught or corrected.  If there are questions or concerns about the contents of this note, they should be addressed directly to the author of the note for clarification.            Oliver Barre, MD  08/03/15 [phone]

## 2015-08-02 NOTE — Progress Notes (Signed)
Neurosurgery Progress Note    NS Examined Date Time: 08/02/2015 5:31 AM  Signed by: Cecilio Asper  Time consult called: 1610  Date/Time: 08/02/2015 5:31 AM

## 2015-08-02 NOTE — ED Provider Notes (Signed)
[facility] EMERGENCY DEPARTMENT H&P                                             ATTENDING SUPERVISORY NOTE             CLINICAL SUMMARY          Diagnosis:    .     Final diagnoses:   Traumatic subarachnoid bleed with LOC of 30 minutes or less, initial encounter         MDM Notes:      Please ask Dr. Imogene Burn these questions if applicable.    Is the patient pregnant and if so does she require rhogam? No    If the patient has chest pain, did he/she receive aspirin?No        Disposition:      ED Disposition     None                    ATTENDING NOTE      The patient was seen and examined by the mid-level (physician's assistant or nurse practitioner), resident or fellow, and the plan of care was discussed with me. I agree with the plan as it was presented to me.  I have reviewed and agree with the final ED diagnosis.    I spoke to and examined the patient as well: Yes  I was present during key portions of any procedures performed: N/A    HPI  Pt had a syncope episode. Pt was sleeping when she stood up and fell afterwards. Pt's husband said she was confused post fall. Pt does not remember event.  ETOH involved x3 drinks. Denies neck pain.    REVIEW OF SYSTEMS: Positive and negative ROS elements as per HPI.  All other systems reviewed and negative.    Constitutional: Vital signs reviewed. Well appearing.  Head: Normocephalic, atraumatic  Eyes: No conjunctival injection. No discharge. PERRL, EOMI  ENT: Mucous membranes moist  Neck: Normal range of motion. Trachea midline. No C-spine tenderness.  Respiratory/Chest: No dyspnea or work of breathing.  Back: no CVA tenderness, no T-L spine tenderness  Upper Extremities:No edema. No cyanosis.  Lower Extremities: No edema. No cyanosis.  Neurological: Alert Normal and symmetric motor tone by observation. Speech normal. Memory Normal. No facial assymetry. CN II-XII intact. Strength 5/5 b/l.  Skin: Warm and dry. No rash.  Psychiatric: Normal affect.  Normal concentration.              VISIT INFORMATION        Clinical Course in the ED:            Medications Given in the ED:    .     ED Medication Orders     None            Procedures:      Procedures      Interpretations:      Differential Diagnosis considered by MD (not completely inclusive): laceration, abrasion, puncture, tendon injury, cellulitis    EKG was Reviewed, Analyzed and Interpreted by me: N/A    Rhythm Strip Interpretation / Cardiac Monitor Analysis: N/A    Pulse Ox Analysis: Yes: saturation: 97 %; Oxygen use: room air; Interpretation: Normal      Critical Care Time: No Critical Care Time          Laboratory  results reviewed and interpreted by me: N/A    Radiologic study results reviewed by me: N/A    Radiologic Studies Interpreted (viewed) by me: N/A    Discuss the patient with other providers: Yes    All tests, tracings, EKGs, vital signs and radiological studies above where applicable were interpreted by me, Gracelyn Nurse MD.            PAST HISTORY        Primary Care Provider: Candise Che, NP        PMH/PSH:    .     History reviewed. No pertinent past medical history.    She has past surgical history that includes D & C, SUCTION (02/18/2013); Hand surgery; and Nasal septum surgery.      Social/Family History:      She reports that she has never smoked. She does not have any smokeless tobacco history on file. She reports that she drinks alcohol. Her drug history is not on file.    No family history on file.      Listed Medications on Arrival:    .     Previous Medications    MULTIPLE VITAMIN (MULTIVITAMIN) TABLET    Take 1 tablet by mouth daily.    NORETHIN ACE-ETH ESTRAD-FE (LOESTRIN 24 FE PO)    Take by mouth.      Allergies: She has No Known Allergies.            RESULTS        Lab Results:      Results     ** No results found for the last 24 hours. **              Radiology Results:      XR Chest AP Portable    (Results Pending)               Scribe Attestation:      I was acting  as a scribe for Gracelyn Nurse, MD on [facility]  Treatment Team: Scribe: Claudette Laws     I am the first provider for this patient and I personally performed the services documented. Treatment Team: Scribe: Claudette Laws is scribing for me on [place]. This note and the patient instructions accurately reflect work and decisions made by me.  Gracelyn Nurse, MD                           Gracelyn Nurse, MD  08/06/15 [phone]

## 2015-08-02 NOTE — H&P (Signed)
TRAUMA HISTORY AND PHYSICAL     Date Time: 08/02/2015 6:27 AM  Patient Name: Tina Gibson  Attending Physician: Urbano Heir, MD  Primary Care Physician: Candise Che, NP    Date of Admission:   08/02/2015  4:22 AM    Trauma Level:   Consult    Assessment/Plan:   The patient has the following active problems:  Patient Active Problem List   Diagnosis   . Missed abortion   . Traumatic subarachnoid bleed with LOC of 30 minutes or less       Plan by systems:  Neuro: Tiny traumatic subarachnoid hematoma.  Syncopal episode.  Will need syncopy work up.  Neuro intact at this point.  Obtain carotid doppler and 2D echo. Pain control as needed  Pulm: IS  CV: Syncopal work up with carotid doppler, 2D echo   Endo: no issues   GI: diet as tolerated, bowel reg  Heme/ID: SCDs, no antibiotics   Renal: monitor  Neuromuscular: ambulate as tol   Psych: supportive   Wounds:  None     Patient will be admitted to: [facility]  Massive transfusion protocol:  No      Consulting Services:   Neurosurgery - [place]    Patient Complaint:   Zailee Vallely is a 45 y.o. female who presents to the hospital after fall from standing.  Patients family reports she had confused speech for about an hour before falling.  She fell back striking her head on carpeted floor.  She has consumed a small amount of wine tonight.     Scene Report:      Scene GCS: Eye opening 4 - spontaneous, Verbal Response 5 - alert/oriented, Motor Response 6 - obeys commands. Total GCS: 15   Transport: ALS, Time of Injury 1 hour ago   Transferred from: From Scene   LOC: Yes   Intubated: No   Hemodynamically: Stable   C-spine immobilized pre-hospital: Yes        The medications, past medical/surgical history, family history, allergies & full review of systems were:  Reviewed    Allergies:   No Known Allergies    Medication:     No prescriptions prior to admission       Past Medical History:     Past Medical History   Diagnosis Date   . Bell's palsy        Past  Surgical History:     Past Surgical History   Procedure Laterality Date   . D & c, suction  02/18/2013     Procedure: D & C, SUCTION;  Surgeon: Chrissie Noa, MD;  Location: [facility] OR;  Service: Gynecology;  Laterality: N/A;   . Hand surgery     . Nasal septum surgery         Family History:   History reviewed.  No pertinent family history.    Social History:     Social History     Social History   . Marital Status: Married     Spouse Name: N/A   . Number of Children: N/A   . Years of Education: N/A     Social History Main Topics   . Smoking status: Never Smoker    . Smokeless tobacco: Not on file   . Alcohol Use: Yes   . Drug Use: Not on file   . Sexual Activity: Not on file     Other Topics Concern   . Not on file     Social History  Narrative       Vaccination:   Tetanus up to date: Unknown    Review of Systems:   Review of Systems   Constitutional: Negative.    HENT: Negative.    Eyes: Negative.    Cardiovascular: Negative.    Gastrointestinal: Negative.    Genitourinary: Negative.    Musculoskeletal: Negative.    Skin: Negative.    Neurological: Negative.    Endo/Heme/Allergies: Negative.    Psychiatric/Behavioral: Negative.    All other systems reviewed and are negative.      Physical Exam:   Physical Exam   Constitutional: She is oriented to person, place, and time and well-developed, well-nourished, and in no distress. Vital signs are normal. She appears not dehydrated. She does not have a sickly appearance.   HENT:   Head: Normocephalic and atraumatic.   Right Ear: External ear normal.   Left Ear: External ear normal.   Nose: Nose normal.   Mouth/Throat: Oropharynx is clear and moist.   Eyes: Conjunctivae and EOM are normal. Pupils are equal, round, and reactive to light.   Neck: Trachea normal and normal range of motion. Neck supple. No tracheal deviation present.   No c/t/l spine tenderness   Cardiovascular: Normal rate, regular rhythm and intact distal pulses.    Pulmonary/Chest: Effort normal and  breath sounds normal. No stridor. She exhibits no tenderness.   Abdominal: Soft. Normal appearance. She exhibits no distension. There is no tenderness.   Musculoskeletal: Normal range of motion.   Neurological: She is alert and oriented to person, place, and time. GCS score is 15.   Skin: Skin is warm and dry.   Nursing note and vitals reviewed.      Filed Vitals:    08/02/15 0428   BP: 118/78   Pulse: 71   Temp: 98.5 F (36.9 C)   Resp: 17   SpO2: 97%       Labs:     Results     ** No results found for the last 24 hours. **          Rads:   Radiological Procedure reviewed.      XR CHEST AP PORTABLE      The following images were received from an outside facility and reviewed:CT Head    Attending Attestation:     I was present for the physical examination and have reviewed the notes, assessments, and/or procedures performed by the resident.  I have edited the note to reflect any changes I have made.  I am in agreement with their plan upon my signature as an Attending.

## 2015-08-02 NOTE — ED Notes (Addendum)
Pt transferred from [facility] with possible subarachnoid hemorrhage to pre-frontal area. PTS reports that pt had a few glasses of wine around 1900-2100. Pt then went home and fell backwards at top of the staircase onto carpeted floor. Pt does not remember the fall or what occurred prior to the fall. Pt state that she thinks her husband may have witnessed but not sure. Pt nauseated. PTS gave 4mg  zofran x 2 in route. Pt state that bright lights are bothersome to her. Lights turned off for her comfort. Pt speaking in complete and clear sentences in NAD. Pt has HA at 1/10 pain. Denies syncope/near syncope/MI/CVA/blood thinners. Pt transferred for neuro consult.

## 2015-08-02 NOTE — Plan of Care (Addendum)
Problem: Pain  Goal: Patient's pain/discomfort is manageable  Outcome: Progressing  Pt rated pain 1-4 throughout the day for headache, medicated with oxycodone 5 mg x1.     Comments:   Pt up to bathroom multiple times throughout the day, ambulating well. One episode of nausea, medicated with Zofran tablet. Pt's VSS with bradycardia. MD notified. Pt reports baseline of low heart rate. Some mild swelling noted on right side of face, but not numbness. Pt stated that she has history of Bells' palsy, with intermittent numbness past few weeks.

## 2015-08-02 NOTE — ED Provider Notes (Signed)
[facility] EMERGENCY DEPARTMENT RESIDENT H&P       CLINICAL INFORMATION        HPI:      Chief Complaint: Head Laceration  .    Tina Gibson is a 45 y.o. female who presents as a transfer for [facility] after a mechanical fall vs syncope. Pt was at home where she had fallen asleep on the couch, stood up to go to bed and on the way fell. Per husband who witnessed event, stated the pt was confused after fall. ETOH prior (3 drinks). Amnestic to the event.     On presentation here, frontal HA 2/10 w/out visual disturbance. N/V prior to arrival treated with zofran. No neck pain, numbness/tingling/weakness in extremities. No prior TBI, no blood thinners, no recent syncope or near syncope. No prodrome, however pt is amnestic to event.     History obtained from: patient, review of prior chart             ROS:      Review of Systems   Constitutional: Negative for chills, diaphoresis, activity change, appetite change and fatigue.   HENT: Negative for congestion, nosebleeds, rhinorrhea and sinus pressure.    Eyes: Negative for discharge, redness and visual disturbance.   Respiratory: Negative for cough, chest tightness and shortness of breath.    Cardiovascular: Negative for chest pain and leg swelling.   Gastrointestinal: Positive for nausea and vomiting. Negative for abdominal pain, diarrhea, constipation and abdominal distention.   Genitourinary: Negative for dysuria, urgency, frequency, hematuria, flank pain and difficulty urinating.   Musculoskeletal: Negative.    Skin: Negative.    Neurological: Positive for headaches. Negative for numbness.   Psychiatric/Behavioral: Negative.          Physical Exam:      Pulse 71  BP 118/78 mmHg  Resp 17  SpO2 97 %  Temp 98.5 F (36.9 C)    Physical Exam   Constitutional: She is oriented to person, place, and time. She appears well-developed and well-nourished. No distress.   HENT:   Head: Normocephalic and atraumatic.   Eyes: Conjunctivae are normal. Right eye  exhibits no discharge. Left eye exhibits no discharge.   Neck: No tracheal deviation present.   Cardiovascular: Normal rate and intact distal pulses.    Pulmonary/Chest: Effort normal. No stridor. No respiratory distress.   Abdominal: She exhibits no distension. There is no tenderness.   Neurological: She is alert and oriented to person, place, and time. No cranial nerve deficit. Coordination normal.   Skin: Skin is warm and dry. No rash noted. She is not diaphoretic. No erythema. No pallor.   Psychiatric: She has a normal mood and affect. Her behavior is normal.               PAST HISTORY        Primary Care Provider: Candise Che, NP        PMH/PSH:    .     History reviewed. No pertinent past medical history.    She has past surgical history that includes D & C, SUCTION (02/18/2013); Hand surgery; and Nasal septum surgery.      Social/Family History:      She reports that she has never smoked. She does not have any smokeless tobacco history on file. She reports that she drinks alcohol. Her drug history is not on file.    No family history on file.      Listed Medications on Arrival:    .  Home Medications     Last Medication Reconciliation Action:  Complete Rosine Abe, RN 08/02/2015  4:29 AM                  Multiple Vitamin (MULTIVITAMIN) tablet     Take 1 tablet by mouth daily.     Norethin Ace-Eth Estrad-FE (LOESTRIN 24 FE PO)     Take by mouth.         Allergies: She has No Known Allergies.            VISIT INFORMATION        Reassessments/Clinical Course:    5:07 AM  Husband en route. Neurosurgery and trauma aware. -EK        Conversations with Other Providers:              Medications Given in the ED:    .     ED Medication Orders     None            Procedures:      Procedures      Assessment/Plan:      45 yo f, traumatic SAH, will admit for monitoring, potential re-imaging.            Fredirick Maudlin, MD  Resident  08/02/15 [phone]    Fredirick Maudlin, MD  Resident  08/02/15 [phone]

## 2015-08-03 ENCOUNTER — Observation Stay: Payer: No Typology Code available for payment source

## 2015-08-03 MED ORDER — BUTALBITAL-APAP-CAFFEINE 50-325-40 MG PO TABS
1.0000 | ORAL_TABLET | ORAL | Status: AC | PRN
Start: 2015-08-03 — End: ?

## 2015-08-03 MED ORDER — PROMETHAZINE HCL 12.5 MG PO TABS
12.5000 mg | ORAL_TABLET | Freq: Four times a day (QID) | ORAL | Status: AC | PRN
Start: 2015-08-03 — End: ?

## 2015-08-03 MED ORDER — BUTALBITAL-APAP-CAFFEINE 50-325-40 MG PO TABS
1.0000 | ORAL_TABLET | Freq: Four times a day (QID) | ORAL | Status: DC | PRN
Start: 2015-08-03 — End: 2015-08-03
  Administered 2015-08-03: 1 via ORAL
  Filled 2015-08-03: qty 1

## 2015-08-03 MED ORDER — PROMETHAZINE HCL 25 MG/ML IJ SOLN
6.2500 mg | Freq: Four times a day (QID) | INTRAMUSCULAR | Status: DC | PRN
Start: 2015-08-03 — End: 2015-08-03
  Administered 2015-08-03: 6.25 mg via INTRAVENOUS
  Filled 2015-08-03: qty 1

## 2015-08-03 MED ORDER — ONDANSETRON HCL 4 MG PO TABS
4.0000 mg | ORAL_TABLET | Freq: Three times a day (TID) | ORAL | Status: AC | PRN
Start: 2015-08-03 — End: ?

## 2015-08-03 MED ORDER — BUTALBITAL-APAP-CAFFEINE 50-325-40 MG PO TABS
1.0000 | ORAL_TABLET | ORAL | Status: DC | PRN
Start: 2015-08-03 — End: 2015-08-03
  Administered 2015-08-03: 1 via ORAL
  Filled 2015-08-03: qty 1

## 2015-08-03 NOTE — SLP Eval Note (Signed)
[facility]   Speech and Language Therapy Evaluation     Patient: Tina Gibson    MRN#: [id number]     Consult received for Tina Gibson for SLP Evaluation and Treatment.    Assessment:     Clinical Impression: Tina Gibson is a 45 y.o. female admitted 08/02/2015 for Traumatic subarachnoid bleed  presenting with functional cognitive-linguistic skills. Patient scored 26/30 on MOCA (normal score). Patient's only errors were with delayed recall of unrelated words, which patient states is her baseline. Patient does report use of compensatory memory strategies at home and work prior to admit. No further speech therapy warranted at this time.    Therapy Diagnosis: cognitive impairment s/p SDH    Treatment Activities: Administered [facility])    Discharge Recommendations:   Expected disposition: Recommendations: No speech therapy  follow up required at this time    Plan:   Plan:   No further speech therapy at this time     Current Hospitalization:  Referring Physician: Gaylene Brooks, MD  Date of Referral: 08/02/15    Medical Diagnosis: Traumatic subarachnoid bleed with LOC of 30 minutes or less    History of Present Illness: Tina Gibson is a 45 y.o. female admitted on 08/02/2015 with small left frontal subarachnoid bleed with LOC, sustained when she had a syncope episode and fall, amnestic to event, GCS 15.      Head CT:  IMPRESSION:    Possible areas of acute subarachnoid hemorrhage in the left frontal  lobe.These findings were discussed with Dr. Oliver Barre at 2:22  AM.  Tina Kindred, MD    08/02/2015 2:22 AM    Patient Active Problem List   Diagnosis   . Missed abortion   . Traumatic subarachnoid bleed with LOC of 30 minutes or less   . Traumatic subarachnoid bleed with LOC of 30 minutes or less, initial encounter     Past Medical/Surgical History:  Past Medical History   Diagnosis Date   . Bell's palsy        Social History:  Living Arrangements: Lives at home  with spouse and two teenage children.   Employment/Education: Works full-time as Nutritional therapist. Bachelors Degree.    Subjective:   Patient is agreeable to participation in the therapy session. Nursing clears patient for therapy. Patient's medical condition is appropriate for Speech therapy intervention at this time. No family present. Patient reports feeling "groggy" after recent medication was administered.     PAIN:  No c/o pain.  Objective:   Observation of Patient/Vital Signs:  Patient is in bed with SCD's in place.    Patient left with call bell within reach, all needs met, SCDs in place, fall mat in place, bed alarm activated and all questions answered. RN notified of session outcome and patient response.     Cognitive Status and Neuro Exam:  MOCA scoring:   Visuospatial/Executive functioning: 5/5  Naming: 3/3  Memory/Delayed Recall: 1/5 (5/5 Immediate recall of words; 1/5 for independent delayed recall of words)  Attention: 6/6  Language: 3/3  Abstraction: 2/2  Orientation: 6/6  Total score on MOCA 26/30 (normal range is greater than 26/30). Patient's only errors were with delayed recall of unrelated words, which patient suspects is baseline.     Educated the patient to role of speech therapy, plan of care, goals of therapy and results, recommendations.      Goals: n/a     Time of treatment:   SLP Received On:  08/03/15  Start Time: 1045  Stop Time: 1110  Time Calculation (min): 25 min      Mickeal Skinner MS, CCC-SLP  08/03/2015

## 2015-08-03 NOTE — Plan of Care (Signed)
Problem: Pain  Goal: Patient's pain/discomfort is manageable  Outcome: Progressing  PRN oxycodone given for headache,  Assessment and reassessment done  Pt tolerating med well    Problem: Neurological Deficit  Goal: Neurological status is stable or improving  Outcome: Progressing  Pt has no significant neurological deficits.

## 2015-08-03 NOTE — Discharge Instructions (Signed)
TRAUMA SERVICE DISCHARGE INSTRUCTIONS    [address]  [place], [place] [address]  [phone]    Discharge Instructions and Follow Up Information:  - You have been discharged from [facility]. Your primary physician team during your stay was the Acute Care Surgery (ACS) service, whose surgeons specialize in General Surgery, Trauma Surgery, and Critical Care. Our surgeons are Board Certified in both General Surgery and Surgical Critical Care.    For Questions or Problems:  - If at any time you have questions or concerns about your medical care, please call the Trauma Services Office at [phone]. After 4:30 pm and on weekends/holidays, for urgent issues you may reach the Acute Care Surgeon on-call by calling the hospital operator at [phone].    To make a Clinic appointment:  call [phone]    Other clinic information:  - The [facility] ("the clinic") is where the ACS physicians see outpatients and hospital patients who need more follow-up after discharge.   The address is [address]  - At your Clinic visit, you will see one of the ACS surgeons, and possibly also a fellow, resident, physician assistant, nurse practitioner, or medical student. We understand that you may have established a relationship with one or more particular surgeons while in the hospital. However, because of scheduling logistics, it is often not possible for you to see the same surgeon who cared for you in the hospital when you come to the Clinic. Please be assured that we will make every effort to make your care after the hospital as seamless as possible. If you have had surgery by one of our surgeons, and additional surgery is planned in the future, that operation will usually be scheduled with your original surgeon unless you request otherwise.    About your Pain Medication:  Treating the pain that resulted from your injury or surgery is important to us. The amount of pain  medicine required varies depending on your kind of surgery or injury, and individual tolerance of pain. The ACS service can manage your general postoperative or post-trauma pain. We are unable to refill pain medication at night or on weekends, so if you think you need more, we ask that you make a good effort to predict when you will run out of pain medications, and call our office during business hours, preferably before 2pm, at least 24-48 hours before you will run out.  If your pain is from injuries or surgery that was handled by another surgery team or consultant, such as Orthopedics, Neurosurgery, or Spine surgery, we kindly request that you call that surgeon's office to address your pain. Since we do not specialize in those areas of your care, we want to make sure that your symptoms are addressed adequately.   In some cases, your pain management needs may extend beyond the acute time period when your injuries are healing. In such cases, your surgeon may advise that you need chronic or ongoing pain management. We are unable to provide this kind of medical care. Such ongoing care may be done by your Family Medicine physician if you have one, or by a Pain Specialist. We can provide you with a list of Pain Specialists in the [place] area.    If you have forms that need a doctor's signature:  - If you have insurance forms, FMLA forms, or worker's compensation forms that need to be filled out by your surgeon, please mail or fax the forms to our   office at the following address:    Trauma Services  [address]  [place], [place] [address]  Fax: [phone]  Phone: [phone]    NO ASPIRIN, ADVIL, MOTRIN, IBUPROFEN, ALEVE OR NAPROXEN UNTIL CLEARED BY NEUROSURGERY, NO COUMADIN, PLAVIX, PRADAXA, AGGRENOX, LOVENOX OR HEPARIN OR ANY TYPE OF BLOOD THINNING MEDICATIONS UNTIL CLEARED BY NEUROSURGERY.    Traumatic Intracranial Hemmorhage (Obs)    You have been seen for an injury that caused bleeding in your  brain.    This can happen from a direct blow to the head. This could be from a fall or hitting the windshield in a car crash. It can happen from an indirect injury. One example is when the brain is shaken inside the skull. In a minor case like yours, the areas of bleeding are small. They will stop without treatment. Over time, the body will break down and remove the blood on its own. Occasionally, these small clots can become a problem over time. However, this is rare. A small number of these injuries can progress. Therefore, doctors will often observe patients with this condition for some time. This is to make sure that the bleeding does not get bigger.     Anyone can get bleeding in the brain under the right conditions. However, some people are at higher risk. This includes the elderly, people who take medicine to thin the blood, and chronic (long-term) alcoholics.     IF YOU ARE ON BLOOD THINNERS YOU MAY GET ADDITIONAL INSTRUCTIONS OR PRECAUTIONS. MAKE SURE THE DOCTOR KNOWS IF YOU ARE TAKING ANY OF THESE. THEY INCLUDE:    Warfarin (Coumadin (TM)), heparin shots, enoxaparin (Lovenox (TM)), clopidogrel (Plavix (TM)), and others.    There are different types of bleeding. They include:   Subdural Hematoma: A subdural hematoma is a type of slow bleeding on the brain. It is when blood collects between the covering of the brain (the dura mater) and the brain itself.    Subarachnoid Hemorrhage: This bleeding is in the spaces around the brain where the spinal fluid flows.   Intraparenchymal Hemorrhage: This bleeding is in the brain tissue. The symptoms of this problem depend on the area where the bleeding is. Often, this injury happens when the brain is bruised from the force of impact.    Some medical problems take time to show up. A short stay in an ED is like having a snap shot picture of your medical problem. It only shows things over a short time. An observation stay is more like a movie: Doctors can watch  your medical problem over time.     You were probably seen by a neurosurgeon (brain surgeon). He or she helped decide if it is safe for you to go home. Sometimes, the doctor will talk about the case with the neurosurgeon. He or she will look at your scan but not see you personally. After your observation stay, the doctor thinks it is safe for you to home.     The doctor may want you to have another CT brain scan in a few days. You will not need to stay overnight at the hospital. IT IS VERY IMPORTANT to have this test done if it is scheduled.    Follow up with your doctor or specialist as directed.    YOU SHOULD SEEK MEDICAL ATTENTION IMMEDIATELY, EITHER HERE OR AT THE NEAREST EMERGENCY DEPARTMENT, IF ANY OF THE FOLLOWING OCCUR:   You have a severe headache.   Your arms get weak,   numb or paralyzed (can t move), especially on one side.   You have slurred speech, one side of your face droops or you have speaking or thinking problems.   You feel overly tired, are confused, have problems thinking or have behavior changes.   You have balance problems or feel dizzy or lightheaded.   You lose consciousness ("pass out") or almost lose consciousness.    You have a seizure.   You have other concerns.   You have any problems getting repeat tests or getting in to see the doctor for follow-up.

## 2015-08-03 NOTE — Final Progress Note (DC Note for stay less than 48 (Signed)
TRAUMA TERTIARY SURVEY FORM AND FINAL PROGRESS NOTE       Interval History:   Tina Gibson is a 45 y.o. female who was admitted 08/02/2015  4:22 AM to [facility] by Einar Pheasant, Benancio Deeds, MD after Fall: Yes.    Substance usage:   social drinker  The patient denies current or previous tobacco use.  denied.    SBRT (Screening, Brief Intervention, and Referral to Treatment) performed: Yes  CATS (Community addiction team) requested: N/A    Allergies:   No Known Allergies    Medical history:     Past Medical History   Diagnosis Date   . Bell's palsy        Medications:     Prior to Admission medications    Medication Sig Start Date End Date Taking? Authorizing Provider   Norethin Ace-Eth Estrad-FE (LOESTRIN 24 FE PO) Take by mouth.   Yes [provider]   Multiple Vitamin (MULTIVITAMIN) tablet Take 1 tablet by mouth daily.    [provider]       Current Facility-Administered Medications   Medication Dose Route Frequency Last Rate Last Dose   . naloxone (NARCAN) injection 0.2 mg  0.2 mg Intravenous PRN       . ondansetron (ZOFRAN) tablet 4 mg  4 mg Oral Q8H PRN   4 mg at 08/02/15 1140    Or   . ondansetron (ZOFRAN) injection 4 mg  4 mg Intravenous Q8H PRN       . oxyCODONE (ROXICODONE) immediate release tablet 10 mg  10 mg Oral Q4H PRN       . oxyCODONE (ROXICODONE) immediate release tablet 5 mg  5 mg Oral Q4H PRN   5 mg at 08/03/15 1610        Verify appropriate medications are reconciled: Yes    Review of systems since admission:   Review of Systems   Constitutional: Negative for fever and chills.   Respiratory: Negative for shortness of breath.    Cardiovascular: Negative for chest pain.   Gastrointestinal: Positive for nausea and vomiting. Negative for abdominal pain.   Genitourinary: Negative.    Musculoskeletal:   Tenderness to L sided head and posterior scalp. No lacerations . No facial contusions or abrasions.  Skin: Negative.    Neurological: Positive for headaches.  Negative for focal weakness and seizures.   Psychiatric/Behavioral: Negative.        Available radiology data:     Radiology Results (24 Hour)     Procedure Component Value Units Date/Time    XR Chest AP Portable [[id number]] Collected:  08/02/15 1191    Order Status:  Completed Updated:  08/02/15 0657    Narrative:      Indication: Trauma.    Procedure: Portable AP view of the chest.    Findings: No pneumothorax. Small discoid opacities at the left lung  base, likely to be foci of atelectasis. Normal cardiac size and  mediastinal contour. No visible effusion.      Impression:       Small foci of left basilar atelectasis.    Wynema Birch, MD   08/02/2015 6:53 AM            Current laboratory data:     Results     ** No results found for the last 24 hours. **          Physical examination:   Physical Exam   Constitutional: She is oriented to person, place,  and time and well-developed, well-nourished, and in no distress. No distress.   HENT:   Head: Normocephalic.   Eyes: EOM are normal. Pupils are equal, round, and reactive to light.   Neck: Normal range of motion.   Cardiovascular: Normal rate, regular rhythm, normal heart sounds and intact distal pulses.    Pulmonary/Chest: Effort normal and breath sounds normal. No respiratory distress.   Abdominal: Soft. Bowel sounds are normal. She exhibits no distension. There is no tenderness.   Musculoskeletal: Normal range of motion.   Contusion to R flank, tender to palpation. MAE   Neurological: She is alert and oriented to person, place, and time.   Skin: Skin is warm and dry.     Vital signs:  Temp:  [97.1 F (36.2 C)-98.8 F (37.1 C)] 98.8 F (37.1 C)  Heart Rate:  [51-80] 69  Resp Rate:  [16-18] 16  BP: (98-119)/(58-77) 98/58 mmHg    List of injuries by system:   L SAH    Problem list:     Patient Active Problem List   Diagnosis   . Missed abortion   . Traumatic subarachnoid bleed with LOC of 30 minutes or less   . Traumatic subarachnoid bleed with LOC of 30  minutes or less, initial encounter       Verify problem list is appropriately updated: Yes    Consulting services:   Neurosurgery - [facility]    Confirm consulting services have been notified: Yes    Assessment and plan:   Neurological: Small L SAH s/p fall after a syncopal episode. No NSGY intervention or need for AEDs. EKG sinus bradycardia.   Orthopedic: NAI  Pulmonary: On RA. Encourage IS  Cardiac: HDS, bradycardic at baseline per patient. BP range from 98-119 systolic.  Endocrine: NAI  Gastrointestinal: Continue diet as tolerated. Bowel regimen PRN. Continue antiemetics for nausea.  Renal: AUOP  Hematologic/ID: No AC/NSAIDs or ASA per NSGY. Afebrile, no indication for abx.  Candidate for Lovenox: No    Neuromuscular: NAI, OOB as tolerated.  Cervical spine clearance: Yes  Psychiatric: Supportive. Cog eval pending  Wounds: None  Disposition: Home today. If ECHO, CUS not performed today, will have patient get outpatient for syncopal workup.    Signed by Buck Mam 08/03/2015 6:12 AM    Trauma surgeon attestation:

## 2015-08-03 NOTE — Plan of Care (Signed)
Problem: Safety  Goal: Patient will be free from injury during hospitalization  Bed alarm activated with floor mats in place.  Intervention: Hourly rounding.  Hourly rounding on pt      Problem: Pain  Goal: Patient's pain/discomfort is manageable  Pt complained of HA 5/10, Fioricet given with diminished pain.    Comments:   Pt had 1 episode of emesis before breakfast, phenergan given. Will continue to monitor pt.

## 2015-09-10 ENCOUNTER — Telehealth: Payer: Self-pay

## 2015-09-10 NOTE — Telephone Encounter (Signed)
Patient requests completion of Group Accident Claim - Physician Statement.  Thank you.  Isabelle Course

## 2015-09-12 ENCOUNTER — Encounter (INDEPENDENT_AMBULATORY_CARE_PROVIDER_SITE_OTHER): Payer: Self-pay

## 2015-09-12 ENCOUNTER — Telehealth: Payer: Self-pay

## 2015-09-12 NOTE — Telephone Encounter (Signed)
Faxed completed Group Accident Claim - Physician Statement to [employer].  Also faxed to the clinic so they can notify the patient.  Isabelle Course

## 2015-09-12 NOTE — Telephone Encounter (Signed)
Group Accident Claim - Physician Statement completed and returned to [facility].    Griselda Miner. Kemper Durie NP [phone]  Trauma /ACS

## 2015-10-24 ENCOUNTER — Other Ambulatory Visit: Payer: Self-pay | Admitting: Neurology

## 2015-10-24 DIAGNOSIS — I609 Nontraumatic subarachnoid hemorrhage, unspecified: Secondary | ICD-10-CM

## 2015-10-24 DIAGNOSIS — F0781 Postconcussional syndrome: Secondary | ICD-10-CM

## 2015-10-30 ENCOUNTER — Ambulatory Visit
Admission: RE | Admit: 2015-10-30 | Discharge: 2015-10-30 | Disposition: A | Discharge status: Home / Self Care | Payer: No Typology Code available for payment source | Source: Ambulatory Visit | Attending: Neurology | Admitting: Neurology

## 2015-10-30 DIAGNOSIS — F0781 Postconcussional syndrome: Secondary | ICD-10-CM | POA: Insufficient documentation

## 2015-10-30 DIAGNOSIS — I959 Hypotension, unspecified: Secondary | ICD-10-CM | POA: Insufficient documentation

## 2015-10-30 DIAGNOSIS — I609 Nontraumatic subarachnoid hemorrhage, unspecified: Secondary | ICD-10-CM | POA: Insufficient documentation

## 2015-10-30 DIAGNOSIS — R42 Dizziness and giddiness: Secondary | ICD-10-CM | POA: Insufficient documentation

## 2015-10-30 DIAGNOSIS — R55 Syncope and collapse: Secondary | ICD-10-CM | POA: Insufficient documentation

## 2016-05-20 ENCOUNTER — Institutional Professional Consult (permissible substitution): Payer: BLUE CROSS/BLUE SHIELD | Admitting: Pulmonary Disease

## 2017-05-03 ENCOUNTER — Other Ambulatory Visit: Payer: Self-pay | Admitting: Obstetrics and Gynecology

## 2017-05-03 DIAGNOSIS — R928 Other abnormal and inconclusive findings on diagnostic imaging of breast: Secondary | ICD-10-CM

## 2017-05-06 ENCOUNTER — Ambulatory Visit
Admission: RE | Admit: 2017-05-06 | Discharge: 2017-05-06 | Disposition: A | Discharge status: Home / Self Care | Payer: BLUE CROSS/BLUE SHIELD | Source: Ambulatory Visit | Attending: Obstetrics and Gynecology | Admitting: Obstetrics and Gynecology

## 2017-05-06 ENCOUNTER — Ambulatory Visit: Payer: BLUE CROSS/BLUE SHIELD

## 2017-05-06 DIAGNOSIS — R928 Other abnormal and inconclusive findings on diagnostic imaging of breast: Secondary | ICD-10-CM

## 2017-07-13 ENCOUNTER — Other Ambulatory Visit: Payer: Self-pay | Admitting: Obstetrics & Gynecology

## 2018-05-23 ENCOUNTER — Ambulatory Visit (INDEPENDENT_AMBULATORY_CARE_PROVIDER_SITE_OTHER): Payer: Self-pay

## 2018-05-23 ENCOUNTER — Ambulatory Visit (INDEPENDENT_AMBULATORY_CARE_PROVIDER_SITE_OTHER): Payer: BLUE CROSS/BLUE SHIELD | Admitting: Orthopaedic Surgery

## 2018-05-23 ENCOUNTER — Encounter (INDEPENDENT_AMBULATORY_CARE_PROVIDER_SITE_OTHER): Payer: Self-pay | Admitting: Orthopaedic Surgery

## 2018-05-23 DIAGNOSIS — M25551 Pain in right hip: Secondary | ICD-10-CM | POA: Diagnosis not present

## 2018-05-23 DIAGNOSIS — M545 Low back pain, unspecified: Secondary | ICD-10-CM

## 2018-05-23 MED ORDER — METHYLPREDNISOLONE 4 MG PO TABS: ORAL_TABLET | ORAL | 0 refills | Status: AC

## 2018-05-23 NOTE — Progress Notes (Signed)
Office Visit Note   Patient: Elizabeth Savage           Date of Birth: 02/26/1971           MRN: [id number] Visit Date: 05/23/2018              Requested by: No referring provider defined for this encounter. PCP: Patient, No Pcp Per   Assessment & Plan: Visit Diagnoses:  1. Pain in right hip   2. Acute right-sided low back pain, unspecified whether sciatica present     Plan: I am concerned about her right lower extremity weakness and the numbness and tingling.  I will start her on a 6-day steroid taper and we will obtain an MRI to rule out herniated disc of the lumbar spine.  All question concerns were answered and addressed.  We will see her back once we have the MRI.  Follow-Up Instructions: Return in about 4 weeks (around 06/20/2018).   Orders:  Orders Placed This Encounter  Procedures  . XR HIP UNILAT W OR W/O PELVIS 1V RIGHT  . XR Lumbar Spine 2-3 Views   Meds ordered this encounter  Medications  . methylPREDNISolone (MEDROL) 4 MG tablet    Sig: Medrol dose pack. Take as instructed    Dispense:  21 tablet    Refill:  0      Procedures: No procedures performed   Clinical Data: No additional findings.   Subjective: Chief Complaint  Patient presents with  . Right Hip - Pain  The patient is very active and pleasant thin 47 year old female who 10 days ago sustained a significant fall off of a high barstool when she was decorating for Christmas.  She landed awkwardly on that hip.  Since then she has had some numbness and tingling going down her right thigh more on the medial aspect of the thigh and the medial aspect of her right leg.  She not had this before.  Going up and down stairs she feels like that right leg is weak and that is giving out on her as well.  She is had some chronic hip issues and some low back issues in the past but nothing acute.  She seen a chiropractor in the past.  She is now on medications now.  She is not a diabetic and not a smoker and again  very thin and active.  HPI  Review of Systems She currently denies any change in bowel bladder function.  She denies any headache, chest pain, short of breath, fever, chills, nausea, vomiting.  She does report some right hand numbness that is acute.  Objective: Vital Signs: There were no vitals taken for this visit.  Physical Exam She is alert and orient x3 and in no acute distress Ortho Exam Examination of her right hip shows full internal X rotation with some slight clicking but there is also clicking in her knee with full motion.  Both hip and knee on the right side are ligamentously stable.  She has a positive straight leg raise on the right side.  She does have subjective numbness on the thigh medially and the leg medially.  There is also bruising on her anterior shin.  Distally her motor and sensory exam in the foot is normal.  There is pain with flexion of lumbar spine but there is no blocks to motion.  She is very flexible on exam. Specialty Comments:  No specialty comments available.  Imaging: Xr Hip Unilat W Or W/o Pelvis 1v  Right  Result Date: 05/23/2018 An AP pelvis and a lateral of the right hip shows no acute findings.  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  There is a small bone spur off the posterior edge of the acetabulum on the right side.  Xr Lumbar Spine 2-3 Views  Result Date: 05/23/2018 2 views of the lumbar spine show no acute findings.  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  The alignment is well-maintained.    PMFS History: Patient Active Problem List   Diagnosis Date Noted  . Chest pain 06/15/2014  . Elevated hemoglobin (HCC) 06/15/2014   History reviewed. No pertinent past medical history.  Family History  Problem Relation Age of Onset  . Breast cancer Neg Hx     Past Surgical History:  Procedure Laterality Date  . APPENDECTOMY    . MOUTH SURGERY     Social History    Occupational History  . Not on file  Tobacco Use  . Smoking status: Never Smoker  Substance and Sexual Activity  . Alcohol use: Yes  . Drug use: No  . Sexual activity: Not on file

## 2018-05-25 ENCOUNTER — Other Ambulatory Visit (INDEPENDENT_AMBULATORY_CARE_PROVIDER_SITE_OTHER): Payer: Self-pay

## 2018-05-25 DIAGNOSIS — M4807 Spinal stenosis, lumbosacral region: Secondary | ICD-10-CM

## 2018-06-13 ENCOUNTER — Telehealth (INDEPENDENT_AMBULATORY_CARE_PROVIDER_SITE_OTHER): Payer: Self-pay | Admitting: Orthopaedic Surgery

## 2018-06-13 MED ORDER — DIAZEPAM 5 MG PO TABS: ORAL_TABLET | ORAL | 0 refills | Status: AC

## 2018-06-13 NOTE — Telephone Encounter (Signed)
Ok to call in a Valium 5mg  to take 30 minutes prior to her MRI.

## 2018-06-13 NOTE — Telephone Encounter (Signed)
Called to pharmacy. I called patient and advised. 

## 2018-06-13 NOTE — Telephone Encounter (Signed)
Please advise 

## 2018-06-13 NOTE — Telephone Encounter (Signed)
Patient called advised she is having an MRI tomorrow at 9:00am and need something to keep her calm. Patient said she just need a little something to take the edge off.  Patient said she still have numbness from her knee down to her ankle in the right leg. Patient said the MRI will be of her back.  Patient uses the [facility] on [address] in [place].The number to contact patient is [phone]

## 2018-06-14 ENCOUNTER — Other Ambulatory Visit: Payer: BLUE CROSS/BLUE SHIELD

## 2018-06-19 ENCOUNTER — Ambulatory Visit
Admission: RE | Admit: 2018-06-19 | Discharge: 2018-06-19 | Disposition: A | Discharge status: Home / Self Care | Payer: BLUE CROSS/BLUE SHIELD | Source: Ambulatory Visit | Attending: Orthopaedic Surgery | Admitting: Orthopaedic Surgery

## 2018-06-19 DIAGNOSIS — M4807 Spinal stenosis, lumbosacral region: Secondary | ICD-10-CM

## 2018-06-20 ENCOUNTER — Ambulatory Visit (INDEPENDENT_AMBULATORY_CARE_PROVIDER_SITE_OTHER): Payer: BLUE CROSS/BLUE SHIELD | Admitting: Orthopaedic Surgery

## 2018-06-22 ENCOUNTER — Encounter (INDEPENDENT_AMBULATORY_CARE_PROVIDER_SITE_OTHER): Payer: Self-pay | Admitting: Orthopaedic Surgery

## 2018-06-22 ENCOUNTER — Ambulatory Visit (INDEPENDENT_AMBULATORY_CARE_PROVIDER_SITE_OTHER): Payer: BLUE CROSS/BLUE SHIELD | Admitting: Orthopaedic Surgery

## 2018-06-22 DIAGNOSIS — M545 Low back pain, unspecified: Secondary | ICD-10-CM

## 2018-06-22 DIAGNOSIS — M25551 Pain in right hip: Secondary | ICD-10-CM

## 2018-06-22 NOTE — Progress Notes (Signed)
The patient is here for follow-up after having an MRI of her lumbar spine.  We were concerned about radicular symptoms going down her right leg after a significant mechanical fall.  She still reports numbness and tingling in the medial thigh and the medial shin area.  She did sustain a contusion to the shin as well.  She does report that she has been slightly getting better.  On exam she does have some pain over the right anterior tibia and the tibial spine as well as the pes bursa area.  There is no effusion of her right knee and her right knee is to come see stable.  Her right hip shows some clicking but otherwise exam is normal.  She has excellent strength in the bilateral lower extremities.  There is some subjective numbness in her right leg.  MRI is reviewed of her lumbar spine and it is normal.  There is no evidence of herniation or nerve compression.  The disc spaces and heights are well-maintained.  Gave her reassurance that this should get better with time.  She is already making some improvements.  All question concerns were answered and addressed.  Follow-up will be as needed.

## 2019-01-30 ENCOUNTER — Other Ambulatory Visit: Payer: Self-pay | Admitting: Family Medicine

## 2019-02-19 IMAGING — MR MR LUMBAR SPINE W/O CM
4 of 5 series · 26 of 48 positions shown · non-contrast
Comparison: 05/23/2018 lumbar radiography

CLINICAL DATA: Lumbosacral spinal stenosis.  Right leg weakness

EXAM:
MRI LUMBAR SPINE WITHOUT CONTRAST
TECHNIQUE: Multiplanar, multisequence MR imaging of the lumbar spine was
performed. No intravenous contrast was administered.

[Series 2: T2 · sagittal · 4.0mm · 0.55mm/px · 6 of 13 slices shown (1 of 2)]
[im 1/13]
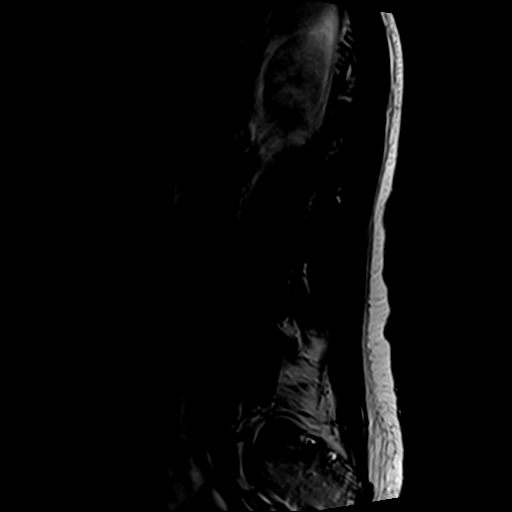
[im 3/13]
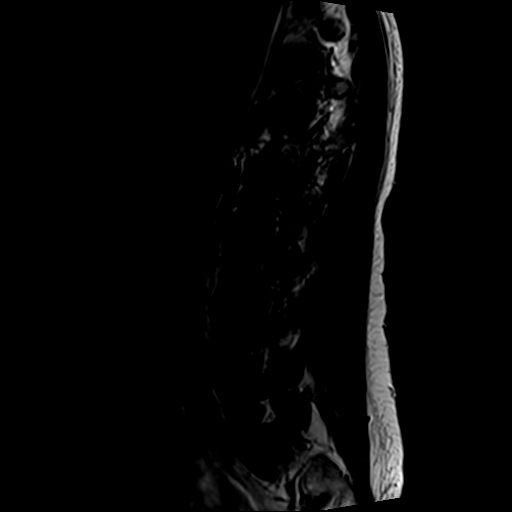
[im 5/13]
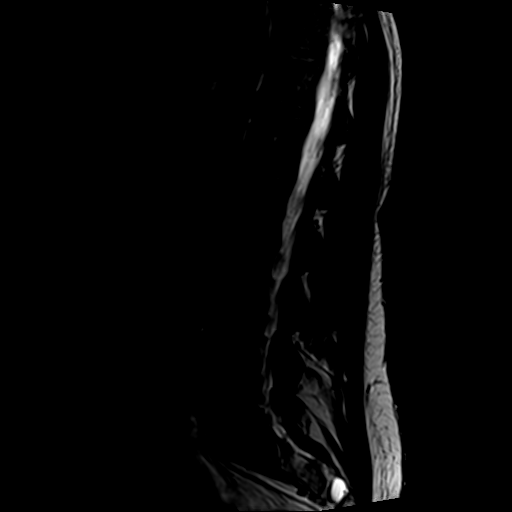
[im 8/13]
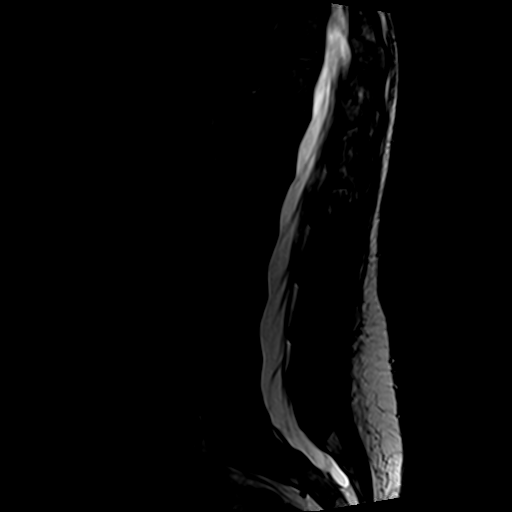
[im 10/13]
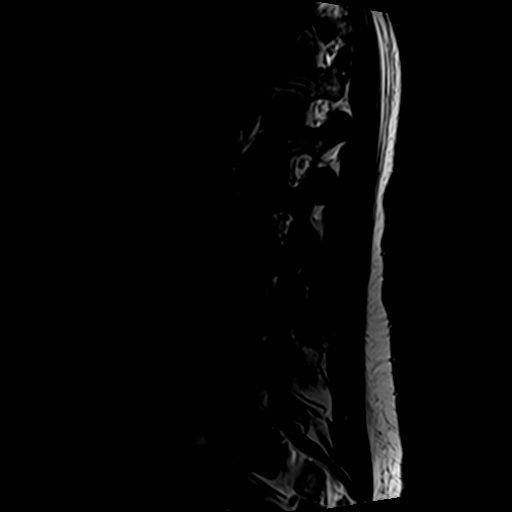
[im 13/13]
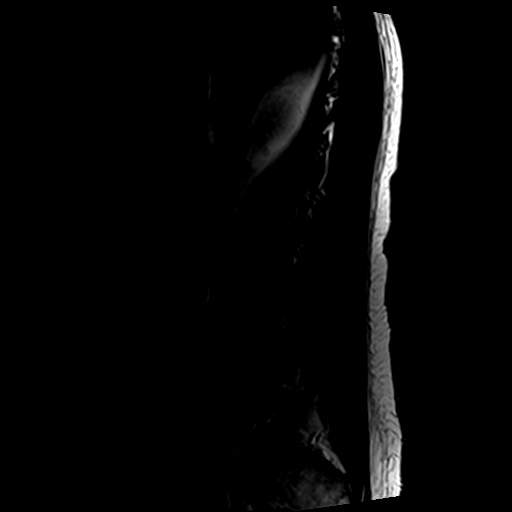

[Series 4: T1 · sagittal · 4.0mm · 0.55mm/px · 5 of 13 slices shown (1 of 2)]
[im 1/13]
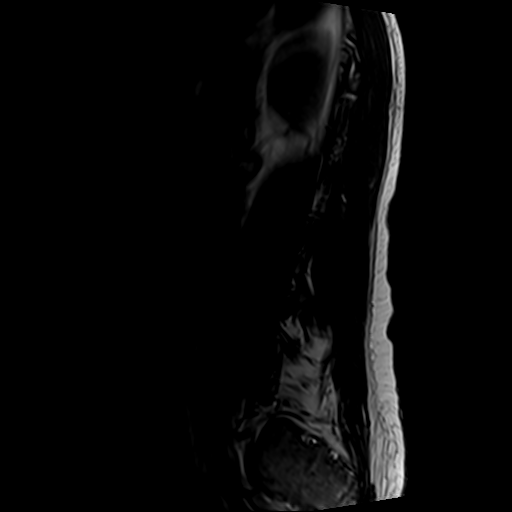
[im 4/13]
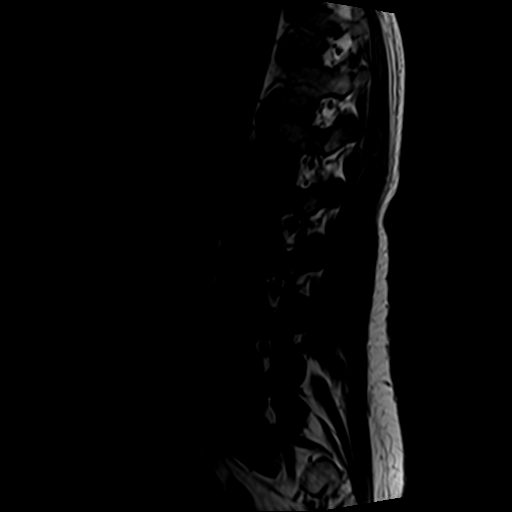
[im 7/13]
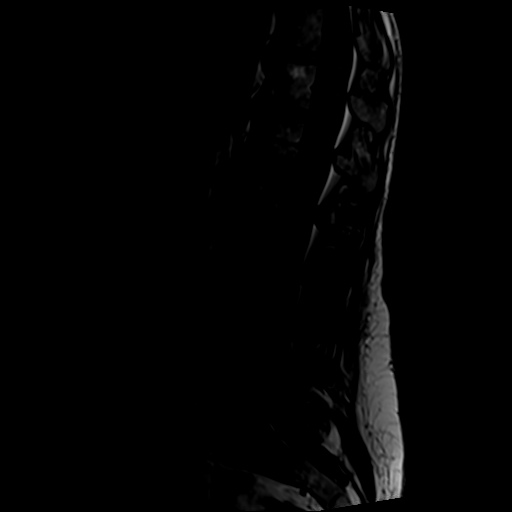
[im 10/13]
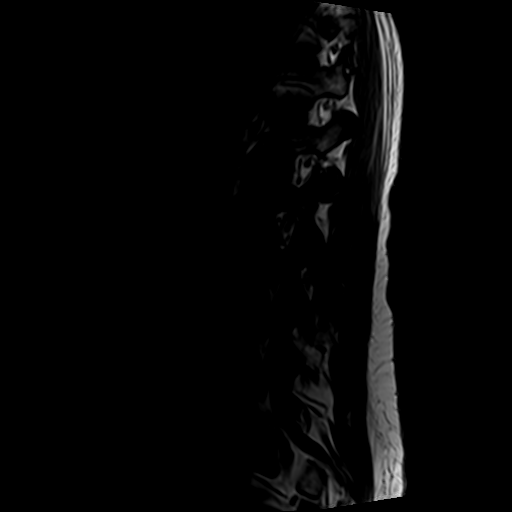
[im 13/13]
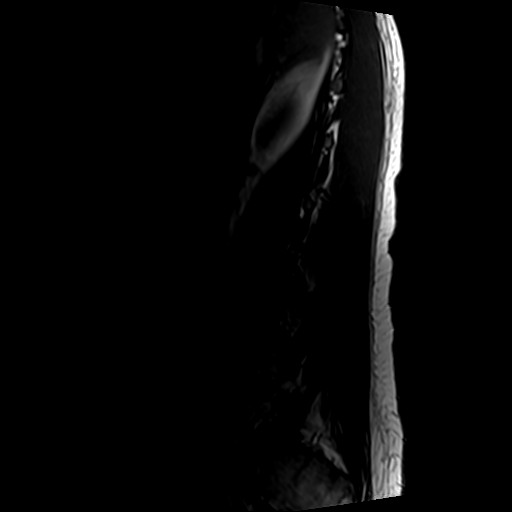

[Series 5: T2 · axial · 4.0mm · 0.70mm/px · z∈[-110,+99]mm · 10 of 38 slices shown (2 of 2)]
[im 3/38]
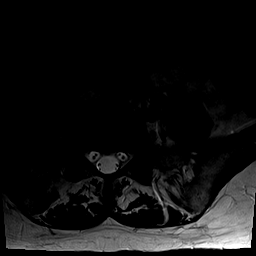
[im 5/38]
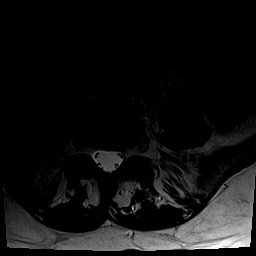
[im 8/38]
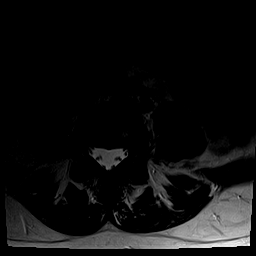
[im 13/38]
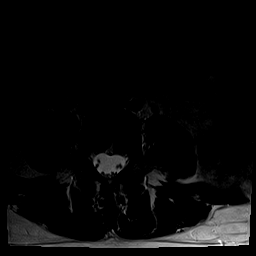
[im 18/38]
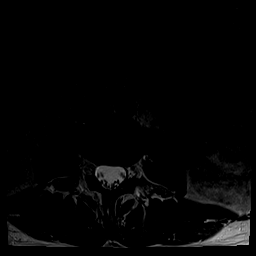
[im 20/38]
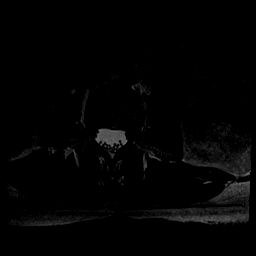
[im 23/38]
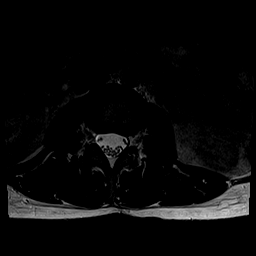
[im 28/38]
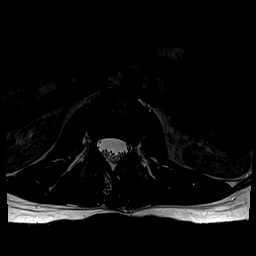
[im 33/38]
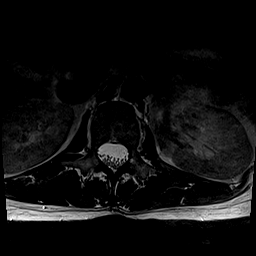
[im 38/38]
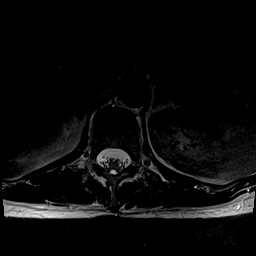

[Series 6: T1 · axial · 4.0mm · 0.35mm/px · z∈[-110,+73]mm · 5 of 38 slices shown (2 of 2)]
[im 3/38]
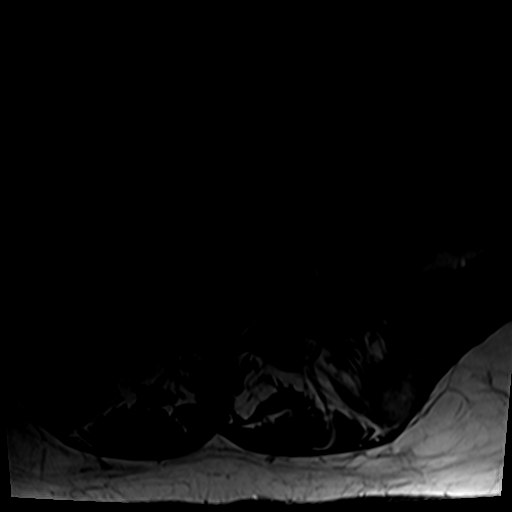
[im 5/38]
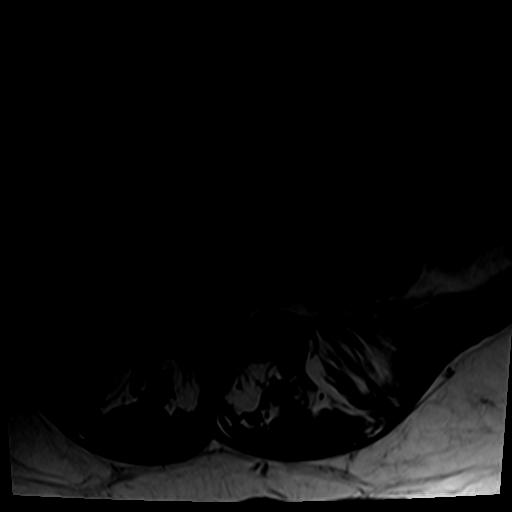
[im 8/38]
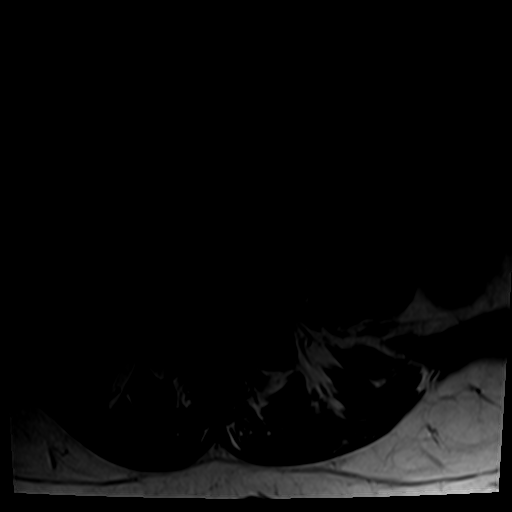
[im 20/38]
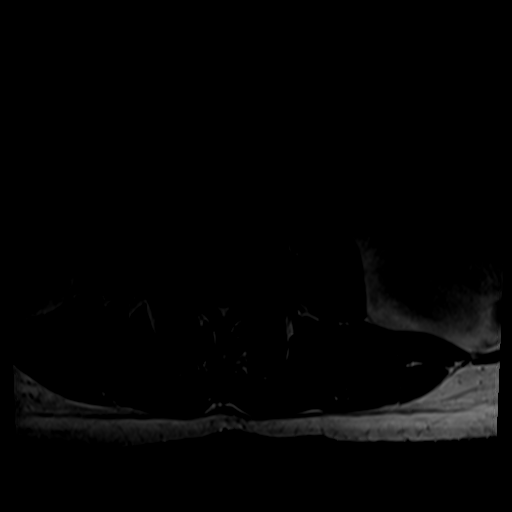
[im 33/38]
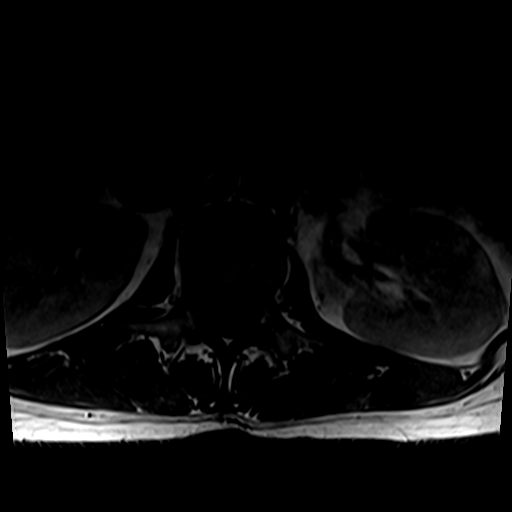

[26 of 48 positions shown; findings below may reference images not displayed]

FINDINGS: Segmentation: Indeterminate rib numbering on prior radiography.
Standard lumbar numbering is assumed.

Alignment:  Normal

Vertebrae:  No fracture, evidence of discitis, or bone lesion.

Conus medullaris and cauda equina: Conus extends to the L1 level.
Conus and cauda equina appear normal.

Paraspinal and other soft tissues: Negative

Disc levels:

Good disc height and hydration. No herniation or facet spurring. No
evident impingement.
IMPRESSION: No degenerative changes or impingement.

## 2019-08-14 ENCOUNTER — Ambulatory Visit: Payer: Self-pay | Attending: Internal Medicine

## 2019-08-14 DIAGNOSIS — Z23 Encounter for immunization: Secondary | ICD-10-CM | POA: Insufficient documentation

## 2019-08-14 NOTE — Progress Notes (Signed)
   Covid-19 Vaccination Clinic  Name:  Elizabeth Savage    MRN: [id number] DOB: [birth year]  08/14/2019  [name] was observed post Covid-19 immunization for 15 minutes without incident. She was provided with Vaccine Information Sheet and instruction to access the V-Safe system.   [name] was instructed to call 911 with any severe reactions post vaccine: Marland Kitchen Difficulty breathing  . Swelling of face and throat  . A fast heartbeat  . A bad rash all over body  . Dizziness and weakness   Immunizations Administered    Name Date Dose VIS Date Route   Pfizer COVID-19 Vaccine 08/14/2019  3:23 PM 0.3 mL 05/19/2019 Intramuscular   Manufacturer: [employer], [employer]   Lot: [id number]   NDC: [address]-6

## 2019-09-13 ENCOUNTER — Ambulatory Visit: Payer: Self-pay | Attending: Internal Medicine

## 2019-09-13 DIAGNOSIS — Z23 Encounter for immunization: Secondary | ICD-10-CM

## 2019-09-13 NOTE — Progress Notes (Signed)
   Covid-19 Vaccination Clinic  Name:  Elizabeth Savage    MRN: [id number] DOB: 1970/10/30  09/13/2019  Elizabeth Savage was observed post Covid-19 immunization for 15 minutes without incident. She was provided with Vaccine Information Sheet and instruction to access the V-Safe system.   Elizabeth Savage was instructed to call 911 with any severe reactions post vaccine: Marland Kitchen Difficulty breathing  . Swelling of face and throat  . A fast heartbeat  . A bad rash all over body  . Dizziness and weakness   Immunizations Administered    Name Date Dose VIS Date Route   Pfizer COVID-19 Vaccine 09/13/2019  2:50 PM 0.3 mL 05/19/2019 Intramuscular   Manufacturer: [employer], [employer]   Lot: [id number]   NDC: [address]-7

## 2019-09-30 ENCOUNTER — Emergency Department
Admission: EM | Admit: 2019-09-30 | Discharge: 2019-09-30 | Disposition: A | Discharge status: Home / Self Care | Payer: 59 | Attending: Emergency Medicine | Admitting: Emergency Medicine

## 2019-09-30 ENCOUNTER — Emergency Department: Payer: 59

## 2019-09-30 DIAGNOSIS — R42 Dizziness and giddiness: Secondary | ICD-10-CM | POA: Insufficient documentation

## 2019-09-30 DIAGNOSIS — R001 Bradycardia, unspecified: Secondary | ICD-10-CM | POA: Insufficient documentation

## 2019-09-30 DIAGNOSIS — R110 Nausea: Secondary | ICD-10-CM | POA: Insufficient documentation

## 2019-09-30 LAB — COMPREHENSIVE METABOLIC PANEL
ALT: 53 U/L (ref 0–55)
AST (SGOT): 31 U/L (ref 5–34)
Albumin/Globulin Ratio: 1.5 (ref 0.9–2.2)
Albumin: 4.1 g/dL (ref 3.5–5.0)
Alkaline Phosphatase: 57 U/L (ref 37–106)
Anion Gap: 11 (ref 5.0–15.0)
BUN: 11.2 mg/dL (ref 7.0–19.0)
Bilirubin, Total: 0.6 mg/dL (ref 0.2–1.2)
CO2: 23 mEq/L (ref 22–29)
Calcium: 9.7 mg/dL (ref 8.5–10.5)
Chloride: 109 mEq/L (ref 100–111)
Creatinine: 0.8 mg/dL (ref 0.6–1.0)
Globulin: 2.8 g/dL (ref 2.0–3.6)
Glucose: 105 mg/dL — ABNORMAL HIGH (ref 70–100)
Potassium: 4.1 mEq/L (ref 3.5–5.1)
Protein, Total: 6.9 g/dL (ref 6.0–8.3)
Sodium: 143 mEq/L (ref 136–145)

## 2019-09-30 LAB — URINALYSIS REFLEX TO MICROSCOPIC EXAM - REFLEX TO CULTURE
Bilirubin, UA: NEGATIVE
Blood, UA: NEGATIVE
Glucose, UA: NEGATIVE
Ketones UA: NEGATIVE
Leukocyte Esterase, UA: NEGATIVE
Nitrite, UA: NEGATIVE
Protein, UR: NEGATIVE
Specific Gravity UA: 1.003 (ref 1.001–1.035)
Urine pH: 6 (ref 5.0–8.0)
Urobilinogen, UA: NORMAL mg/dL (ref 0.2–2.0)

## 2019-09-30 LAB — CBC AND DIFFERENTIAL
Absolute NRBC: 0 10*3/uL (ref 0.00–0.00)
Basophils Absolute Automated: 0.05 10*3/uL (ref 0.00–0.08)
Basophils Automated: 0.9 %
Eosinophils Absolute Automated: 0.1 10*3/uL (ref 0.00–0.44)
Eosinophils Automated: 1.8 %
Hematocrit: 43.6 % (ref 34.7–43.7)
Hgb: 14.4 g/dL (ref 11.4–14.8)
Immature Granulocytes Absolute: 0.01 10*3/uL (ref 0.00–0.07)
Immature Granulocytes: 0.2 %
Lymphocytes Absolute Automated: 1.86 10*3/uL (ref 0.42–3.22)
Lymphocytes Automated: 34.1 %
MCH: 29.9 pg (ref 25.1–33.5)
MCHC: 33 g/dL (ref 31.5–35.8)
MCV: 90.6 fL (ref 78.0–96.0)
MPV: 10.4 fL (ref 8.9–12.5)
Monocytes Absolute Automated: 0.45 10*3/uL (ref 0.21–0.85)
Monocytes: 8.2 %
Neutrophils Absolute: 2.99 10*3/uL (ref 1.10–6.33)
Neutrophils: 54.8 %
Nucleated RBC: 0 /100 WBC (ref 0.0–0.0)
Platelets: 332 10*3/uL (ref 142–346)
RBC: 4.81 10*6/uL (ref 3.90–5.10)
RDW: 12 % (ref 11–15)
WBC: 5.46 10*3/uL (ref 3.10–9.50)

## 2019-09-30 LAB — TROPONIN I: Troponin I: 0.01 ng/mL (ref 0.00–0.05)

## 2019-09-30 LAB — B-TYPE NATRIURETIC PEPTIDE: B-Natriuretic Peptide: 55.1 pg/mL (ref 0.0–100.0)

## 2019-09-30 LAB — GFR: EGFR: >60

## 2019-09-30 LAB — HCG, SERUM, QUALITATIVE: Hcg Qualitative: NEGATIVE

## 2019-09-30 MED ORDER — MECLIZINE HCL 25 MG PO TABS
25.00 mg | ORAL_TABLET | Freq: Three times a day (TID) | ORAL | 0 refills | Status: AC | PRN
Start: 2019-09-30 — End: ?

## 2019-09-30 MED ORDER — SODIUM CHLORIDE 0.9 % IV BOLUS
1000.00 mL | Freq: Once | INTRAVENOUS | Status: AC
Start: 2019-09-30 — End: 2019-09-30
  Administered 2019-09-30: 16:00:00 1000 mL via INTRAVENOUS

## 2019-09-30 MED ORDER — MECLIZINE HCL 12.5 MG PO TABS
25.00 mg | ORAL_TABLET | Freq: Once | ORAL | Status: AC
Start: 2019-09-30 — End: 2019-09-30
  Administered 2019-09-30: 16:00:00 25 mg via ORAL
  Filled 2019-09-30: qty 2

## 2019-09-30 NOTE — Discharge Instructions (Signed)
Please rest and avoid aggravating activities. Please follow-up. Return to the ER for any concerns.     Dizziness, Nonspecific    You have been seen for dizziness.     Dizziness can mean different things to different people. Some people use dizziness to mean the feeling of spinning when there is no actual movement. This often causes nausea (feeling sick). The medical term for this is "vertigo." Others people use the word dizzy to mean "feeling lightheaded," like you might faint. This feeling is usually made better when lying down. For some people, neither of these describes how they are feeling. It can just be a feeling that makes you unsteady. This feeling is common in older people. It can be caused by a number of things. These include poor vision or hearing, foot problems and arthritis. It can also be caused by middle ear or sinus problems. The feeling can come and go.    Dizziness is also caused by more serious things. This includes strokes and heart problems.    It is NEVER normal to have the kind of dizziness you have today together with:   Chest pain.   Problems walking because of problems with balance. Especially if you are falling to one side.   Weakness, numbness or tingling in a part of your body.   Drooping of one side of your face.   Confusion.   Severe headache.   Problems speaking.    If you have these symptoms, it is VERY IMPORTANT to go to the nearest emergency department.    Your tests today were negative (normal). This means we found no life-threatening causes for your dizziness. It is OK for you to go home.    See your primary care doctor for more work-up of your dizziness.     YOU SHOULD SEEK MEDICAL ATTENTION IMMEDIATELY, EITHER HERE OR AT THE NEAREST EMERGENCY DEPARTMENT, IF ANY OF THE FOLLOWING OCCUR:   You cannot speak clearly (slurring), one side of your face droops or you feel weak in the arms or legs (especially on one side).   You have problems with your balance.   You  have problems hearing or there is ringing or a feeling of fullness in your ear.   You lose consciousness ("pass out" or faint).   You have severe headache with dizziness.   You have fever (temperature higher than 100.4F / 38C).   You fall and hit your head.               Vertigo    You have been diagnosed with vertigo.    Vertigo means "the feeling of spinning." People with vertigo have an intense feeling that the room is spinning. This is often called "dizziness."    Most of the time the cause of vertigo is not serious. The most common cause is a balance problem in the inner ear.    The usual treatment is medication to help relieve the spinning feeling and to control nausea.    DO NOT drive a motor vehicle or operate any other equipment that requires concentration until your symptoms have resolved. Be very careful going up and down stairs.    If your symptoms continue your doctor may order an MRI of your brain to make sure there is not a more serious cause of your vertigo.    YOU SHOULD SEEK MEDICAL ATTENTION IMMEDIATELY, EITHER HERE OR AT THE NEAREST EMERGENCY DEPARTMENT, IF ANY OF THE FOLLOWING OCCURS:   You feel numbness,   tingling, or weakness in your arms or legs or become unable to walk.   Your symptoms become worse, even with medication.   You have a severe headache.   You have vomiting that makes it hard to take or keep down medication.

## 2019-09-30 NOTE — ED Provider Notes (Signed)
History     Chief Complaint   Patient presents with    Dizziness     48 YOF presents to the ED c/o "I'm dizzy." Pt reports began at 1030 this am, associated with some nausea. Pt reports worse with sitting up and walking. Nausea has resolved.  She denies any chest pain or shortness of breath Pt has not tried  any over-the-counter medications.  Patient reports that approximately 5 years ago she stood up too quickly and got dizzy and fell striking her head sustaining a head bleed and so she got nervous when this happened.  Patient did report that she is on her watch that her heart rate was in the upper 40s and it is usually in the 50s and was unsure if this was related.  Cough fever chills.  No known sick contacts.  Patient denies any pain or radiation    The history is provided by the patient. No language interpreter was used.   Dizziness  Description: "maybe like I am on a ship and going to fall over."  Severity:  Mild  Onset quality:  Gradual  Timing:  Constant  Progression:  Unchanged  Chronicity:  New  Context: head movement and standing up    Relieved by:  None tried  Worsened by:  Nothing  Ineffective treatments:  None tried  Associated symptoms: no chest pain, no headaches, no nausea, no palpitations, no shortness of breath, no vomiting and no weakness    Risk factors: no hx of vertigo and no new medications         Past Medical History:   Diagnosis Date    Bell's palsy        Past Surgical History:   Procedure Laterality Date    D & C, SUCTION  02/18/2013    Procedure: D & C, SUCTION;  Surgeon: Chrissie Noa, MD;  Location: [facility];  Service: Gynecology;  Laterality: N/A;    HAND SURGERY      NASAL SEPTUM SURGERY         History reviewed. No pertinent family history.    Social - lives with family   Social History     Tobacco Use    Smoking status: Never Smoker    Smokeless tobacco: Never Used   Substance Use Topics    Alcohol use: Yes     Comment: socially (weekends)    Drug use: Never        .     No Known Allergies    Home Medications     Med List Status: In Progress Set By: Marguarite Arbour, RN at 09/30/2019  2:27 PM                Multiple Vitamin (MULTIVITAMIN) tablet     Take 1 tablet by mouth daily.     Norethin Ace-Eth Estrad-FE (LOESTRIN 24 FE PO)     Take by mouth.           Flagged for Removal             butalbital-acetaminophen-caffeine (FIORICET, ESGIC) 50-325-40 MG per tablet     Take 1 tablet by mouth every 4 (four) hours as needed for Headaches.     ondansetron (ZOFRAN) 4 MG tablet     Take 1 tablet (4 mg total) by mouth every 8 (eight) hours as needed.     promethazine (PHENERGAN) 12.5 MG tablet     Take 1 tablet (12.5 mg total) by mouth  every 6 (six) hours as needed for Nausea.           Review of Systems   Constitutional: Negative for chills and fever.   HENT: Negative for congestion and rhinorrhea.    Eyes: Negative for discharge and redness.   Respiratory: Negative for cough and shortness of breath.    Cardiovascular: Negative for chest pain and palpitations.   Gastrointestinal: Negative for abdominal pain, nausea and vomiting.   Genitourinary: Negative for dysuria, flank pain, frequency, hematuria and urgency.   Musculoskeletal: Negative for back pain, gait problem, neck pain and neck stiffness.   Skin: Negative for color change, pallor and wound.   Neurological: Positive for dizziness. Negative for tremors, seizures, syncope, facial asymmetry, speech difficulty, weakness, light-headedness, numbness and headaches.   Hematological: Does not bruise/bleed easily.   Psychiatric/Behavioral: Negative for self-injury. The patient is not nervous/anxious.        Physical Exam    BP: 118/81, Heart Rate: 60, Temp: 98.3 F (36.8 C), Resp Rate: 16, SpO2: 99 %, Weight: 79.8 kg    Physical Exam  Vitals and nursing note reviewed.   Constitutional:       General: She is not in acute distress.     Appearance: She is well-developed. She is not diaphoretic.      Comments: Pt resting comfortably  in NAD   HENT:      Head: Normocephalic and atraumatic.      Right Ear: External ear normal.      Left Ear: External ear normal.      Nose: Nose normal.      Mouth/Throat:      Pharynx: No oropharyngeal exudate.   Eyes:      General: No scleral icterus.        Right eye: No discharge.         Left eye: No discharge.      Conjunctiva/sclera: Conjunctivae normal.      Pupils: Pupils are equal, round, and reactive to light.   Neck:      Vascular: No JVD.      Trachea: No tracheal deviation.   Cardiovascular:      Rate and Rhythm: Normal rate and regular rhythm.   Pulmonary:      Effort: Pulmonary effort is normal. No respiratory distress.      Breath sounds: Normal breath sounds.   Abdominal:      General: Bowel sounds are normal. There is no distension.      Palpations: Abdomen is soft. There is no mass.      Tenderness: There is no abdominal tenderness. There is no right CVA tenderness, left CVA tenderness, guarding or rebound.   Musculoskeletal:         General: No tenderness. Normal range of motion.      Cervical back: Normal range of motion and neck supple.   Skin:     General: Skin is warm and dry.      Findings: No rash.   Neurological:      General: No focal deficit present.      Mental Status: She is alert and oriented to person, place, and time.      Cranial Nerves: No cranial nerve deficit.      Sensory: No sensory deficit.      Motor: No weakness.      Coordination: Coordination normal.      Gait: Gait normal.   Psychiatric:         Mood and Affect:  Mood normal.         Behavior: Behavior normal.         Thought Content: Thought content normal.         Judgment: Judgment normal.           MDM and ED Course     ED Medication Orders (From admission, onward)    Start Ordered     Status Ordering Provider    09/30/19 1523 09/30/19 1522  sodium chloride 0.9 % bolus 1,000 mL  Once     Route: Intravenous  Ordered Dose: 1,000 mL     Last MAR action: New Bag CONCAUGH-GRUENDEL, Abraham Margulies E    09/30/19 1512 09/30/19  1511  meclizine (ANTIVERT) tablet 25 mg  Once     Route: Oral  Ordered Dose: 25 mg     Last MAR action: Given CONCAUGH-GRUENDEL, Ashanty Coltrane E             MDM  Number of Diagnoses or Management Options  Bradycardia  Dizziness  Diagnosis management comments: ILangley Gauss, PA-C, have been the primary provider for Tina Gibson during this Emergency Dept visit. The attending signature signifies review and agreement of the history, physical exam, evaluation, clinical impression and plan except as noted.   I have reviewed the nursing notes, including Past medical and surgical,Family and Social History     Oxygen Saturation by Pulse Oximetry  is 95%-100% - normal, no interventions needed     EKG Interpretation  EKG interpreted by ED physician  Rate: Normal for age.  Rhythm: Normal sinus rhythm  Axis: Normal for age  PR, QRS and QT intervals:  normal for age and rate  ST Segments: No deviations suggestive of ischemia  Impression: Normal ECG with no evidence of ischemia.    DDX: Vertigo, dizziness, dehydration, electrolyte imbalance, symptomatic bradycardia    Labs reviewed by me -  CBC,  CMP, HCG is negative, troponin is WNL   CXR - NAD     Multiple reassessments of the patient. Pt resting comfortably in NAD. Pt with relief after meds.  Patient reports all symptoms have completely resolved.  Patient is up and amatory around the ER without difficulty or assistance in no acute distress discussed with patient need for rest, avoid aggravating activities and follow-up. Return to the ER for any concerns. Pt voices understanding. No questions.            Amount and/or Complexity of Data Reviewed  Clinical lab tests: ordered and reviewed  Tests in the radiology section of CPT: ordered and reviewed  Discuss the patient with other providers: yes  Independent visualization of images, tracings, or specimens: yes    Risk of Complications, Morbidity, and/or Mortality  Presenting problems: moderate  Management options:  moderate    Patient Progress  Patient progress: stable                   Procedures  Results     Procedure Component Value Units Date/Time    B-type Natriuretic Peptide [[id number]] Collected: 09/30/19 1440    Specimen: Blood Updated: 09/30/19 1515     B-Natriuretic Peptide 55.1 pg/mL     Narrative:      Replace urinary catheter prior to obtaining the urine culture  if it has been in place for greater than or equal to 14  days:->N/A No Foley  Indications for U/A Reflex to Micro - Reflex to  Culture:->Suprapubic Pain/Tenderness or Dysuria    Troponin I [[id number]] Collected:  09/30/19 1440    Specimen: Blood Updated: 09/30/19 1509     Troponin I 0.01 ng/mL     Narrative:      Replace urinary catheter prior to obtaining the urine culture  if it has been in place for greater than or equal to 14  days:->N/A No Foley  Indications for U/A Reflex to Micro - Reflex to  Culture:->Suprapubic Pain/Tenderness or Dysuria    GFR [[id number]] Collected: 09/30/19 1440     Updated: 09/30/19 1505     EGFR >60.0    Narrative:      Replace urinary catheter prior to obtaining the urine culture  if it has been in place for greater than or equal to 14  days:->N/A No Foley  Indications for U/A Reflex to Micro - Reflex to  Culture:->Suprapubic Pain/Tenderness or Dysuria    Comprehensive metabolic panel [[id number]]  (Abnormal) Collected: 09/30/19 1440    Specimen: Blood Updated: 09/30/19 1505     Glucose 105 mg/dL      BUN 36.6 mg/dL      Creatinine 0.8 mg/dL      Sodium 440 mEq/L      Potassium 4.1 mEq/L      Chloride 109 mEq/L      CO2 23 mEq/L      Calcium 9.7 mg/dL      Protein, Total 6.9 g/dL      Albumin 4.1 g/dL      AST (SGOT) 31 U/L      ALT 53 U/L      Alkaline Phosphatase 57 U/L      Bilirubin, Total 0.6 mg/dL      Globulin 2.8 g/dL      Albumin/Globulin Ratio 1.5     Anion Gap 11.0    Narrative:      Replace urinary catheter prior to obtaining the urine culture  if it has been in place for greater than or equal to 14  days:->N/A No  Foley  Indications for U/A Reflex to Micro - Reflex to  Culture:->Suprapubic Pain/Tenderness or Dysuria    Beta HCG, Qual, Serum [[id number]] Collected: 09/30/19 1440    Specimen: Blood Updated: 09/30/19 1458     Hcg Qualitative Negative    Narrative:      Replace urinary catheter prior to obtaining the urine culture  if it has been in place for greater than or equal to 14  days:->N/A No Foley  Indications for U/A Reflex to Micro - Reflex to  Culture:->Suprapubic Pain/Tenderness or Dysuria    UA Reflex to Micro - Reflex to Culture [[id number]] Collected: 09/30/19 1440     Updated: 09/30/19 1451     Urine Type Urine, Clean Ca     Color, UA Colorless     Clarity, UA Clear     Specific Gravity UA 1.003     Urine pH 6.0     Leukocyte Esterase, UA Negative     Nitrite, UA Negative     Protein, UR Negative     Glucose, UA Negative     Ketones UA Negative     Urobilinogen, UA Normal mg/dL      Bilirubin, UA Negative     Blood, UA Negative    Narrative:      Replace urinary catheter prior to obtaining the urine culture  if it has been in place for greater than or equal to 14  days:->N/A No Foley  Indications for U/A Reflex to Micro - Reflex to  Culture:->Suprapubic Pain/Tenderness or Dysuria  CBC and differential [[id number]] Collected: 09/30/19 1440    Specimen: Blood Updated: 09/30/19 1448     WBC 5.46 x10 3/uL      Hgb 14.4 g/dL      Hematocrit 37.1 %      Platelets 332 x10 3/uL      RBC 4.81 x10 6/uL      MCV 90.6 fL      MCH 29.9 pg      MCHC 33.0 g/dL      RDW 12 %      MPV 10.4 fL      Neutrophils 54.8 %      Lymphocytes Automated 34.1 %      Monocytes 8.2 %      Eosinophils Automated 1.8 %      Basophils Automated 0.9 %      Immature Granulocytes 0.2 %      Nucleated RBC 0.0 /100 WBC      Neutrophils Absolute 2.99 x10 3/uL      Lymphocytes Absolute Automated 1.86 x10 3/uL      Monocytes Absolute Automated 0.45 x10 3/uL      Eosinophils Absolute Automated 0.10 x10 3/uL      Basophils Absolute Automated 0.05 x10 3/uL       Immature Granulocytes Absolute 0.01 x10 3/uL      Absolute NRBC 0.00 x10 3/uL     Narrative:      Replace urinary catheter prior to obtaining the urine culture  if it has been in place for greater than or equal to 14  days:->N/A No Foley  Indications for U/A Reflex to Micro - Reflex to  Culture:->Suprapubic Pain/Tenderness or Dysuria          Radiology Results (24 Hour)     Procedure Component Value Units Date/Time    XR Chest  AP Portable [[id number]] Collected: 09/30/19 1551    Order Status: Completed Updated: 09/30/19 1553    Narrative:      History: Dizziness.    FINDINGS:    The lungs are clear. The cardiomediastinal silhouette is within normal  limits. There are no effusions.      Impression:       No acute disease.    Rocky Crafts, MD   09/30/2019 3:51 PM    CT Head without Contrast [[id number]] Collected: 09/30/19 1517    Order Status: Completed Updated: 09/30/19 1520    Narrative:      HISTORY: Dizziness     COMPARISON: A head CT dated 08/02/2015 and brain MR dated 10/30/2015    TECHNIQUE: Noncontrast CT imaging of the head was performed.    The following dose reduction techniques were utilized: automated  exposure control and/or adjustment of the mA and/or kV according to  patient size, and the use of iterative reconstruction technique.    FINDINGS: There is no hemorrhage, edema, mass-effect, midline shift or  hydrocephalus. The basilar cisterns are maintained. There is no  significant mastoid or paranasal sinus disease.      Impression:       No hemorrhage or acute intracranial abnormality is detected.    Otho Ket, DO   09/30/2019 3:18 PM          Clinical Impression & Disposition     Clinical Impression  Final diagnoses:   Dizziness   Bradycardia        ED Disposition     ED Disposition Condition Date/Time Comment    Discharge  Sat Sep 30, 2019  4:30 PM  Tina Gibson discharge to home/self care.    Condition at disposition: Stable           New Prescriptions    MECLIZINE (ANTIVERT) 25 MG TABLET     Take 1 tablet (25 mg total) by mouth 3 (three) times daily as needed for Dizziness                 Concaugh-Gruendel, Sebastian Ache, PA  09/30/19 1635       Kalman Drape, MD  09/30/19 2258

## 2019-10-01 LAB — ECG 12-LEAD
Atrial Rate: 61 {beats}/min
P Axis: 37 degrees
P-R Interval: 132 ms
Q-T Interval: 422 ms
QRS Duration: 74 ms
QTC Calculation (Bezet): 424 ms
R Axis: -27 degrees
T Axis: 13 degrees
Ventricular Rate: 61 {beats}/min

## 2020-07-16 ENCOUNTER — Other Ambulatory Visit: Payer: Self-pay | Admitting: Obstetrics & Gynecology

## 2021-09-19 ENCOUNTER — Other Ambulatory Visit: Payer: Self-pay | Admitting: Obstetrics & Gynecology

## 2022-09-29 ENCOUNTER — Other Ambulatory Visit: Payer: Self-pay | Admitting: Obstetrics & Gynecology

## 2023-05-25 DIAGNOSIS — Z6820 Body mass index (BMI) 20.0-20.9, adult: Secondary | ICD-10-CM | POA: Diagnosis not present

## 2023-05-25 DIAGNOSIS — Z113 Encounter for screening for infections with a predominantly sexual mode of transmission: Secondary | ICD-10-CM | POA: Diagnosis not present

## 2023-05-25 DIAGNOSIS — R300 Dysuria: Secondary | ICD-10-CM | POA: Diagnosis not present

## 2023-07-07 DIAGNOSIS — N760 Acute vaginitis: Secondary | ICD-10-CM | POA: Diagnosis not present

## 2023-07-07 DIAGNOSIS — Z6820 Body mass index (BMI) 20.0-20.9, adult: Secondary | ICD-10-CM | POA: Diagnosis not present

## 2023-07-07 DIAGNOSIS — N898 Other specified noninflammatory disorders of vagina: Secondary | ICD-10-CM | POA: Diagnosis not present

## 2023-08-27 DIAGNOSIS — Z1329 Encounter for screening for other suspected endocrine disorder: Secondary | ICD-10-CM | POA: Diagnosis not present

## 2023-08-27 DIAGNOSIS — Z13220 Encounter for screening for lipoid disorders: Secondary | ICD-10-CM | POA: Diagnosis not present

## 2023-08-27 DIAGNOSIS — Z13228 Encounter for screening for other metabolic disorders: Secondary | ICD-10-CM | POA: Diagnosis not present

## 2023-08-27 DIAGNOSIS — Z01419 Encounter for gynecological examination (general) (routine) without abnormal findings: Secondary | ICD-10-CM | POA: Diagnosis not present

## 2023-08-27 DIAGNOSIS — Z1151 Encounter for screening for human papillomavirus (HPV): Secondary | ICD-10-CM | POA: Diagnosis not present

## 2023-08-27 DIAGNOSIS — Z131 Encounter for screening for diabetes mellitus: Secondary | ICD-10-CM | POA: Diagnosis not present

## 2023-08-27 DIAGNOSIS — Z124 Encounter for screening for malignant neoplasm of cervix: Secondary | ICD-10-CM | POA: Diagnosis not present

## 2023-08-27 DIAGNOSIS — E8941 Symptomatic postprocedural ovarian failure: Secondary | ICD-10-CM | POA: Diagnosis not present

## 2023-08-27 DIAGNOSIS — Z6821 Body mass index (BMI) 21.0-21.9, adult: Secondary | ICD-10-CM | POA: Diagnosis not present

## 2023-08-27 DIAGNOSIS — Z1231 Encounter for screening mammogram for malignant neoplasm of breast: Secondary | ICD-10-CM | POA: Diagnosis not present

## 2023-08-27 DIAGNOSIS — Z130 Encounter for screening for diseases of the blood and blood-forming organs and certain disorders involving the immune mechanism: Secondary | ICD-10-CM | POA: Diagnosis not present

## 2023-08-27 DIAGNOSIS — Z1321 Encounter for screening for nutritional disorder: Secondary | ICD-10-CM | POA: Diagnosis not present

## 2023-11-23 ENCOUNTER — Other Ambulatory Visit: Payer: Self-pay | Admitting: Obstetrics & Gynecology
